# Patient Record
Sex: Female | Born: 1935 | Race: White | Hispanic: No | Smoking: Former smoker
Health system: Southern US, Community
[De-identification: ages and names within clinical notes are randomized; demographics above are authoritative.]

## PROBLEM LIST (undated history)

## (undated) DIAGNOSIS — Z8711 Personal history of peptic ulcer disease: Secondary | ICD-10-CM

## (undated) DIAGNOSIS — F329 Major depressive disorder, single episode, unspecified: Secondary | ICD-10-CM

## (undated) DIAGNOSIS — J449 Chronic obstructive pulmonary disease, unspecified: Secondary | ICD-10-CM

## (undated) DIAGNOSIS — J45909 Unspecified asthma, uncomplicated: Secondary | ICD-10-CM

## (undated) DIAGNOSIS — M810 Age-related osteoporosis without current pathological fracture: Secondary | ICD-10-CM

## (undated) DIAGNOSIS — M1990 Unspecified osteoarthritis, unspecified site: Secondary | ICD-10-CM

## (undated) DIAGNOSIS — F32A Depression, unspecified: Secondary | ICD-10-CM

## (undated) DIAGNOSIS — IMO0001 Reserved for inherently not codable concepts without codable children: Secondary | ICD-10-CM

## (undated) DIAGNOSIS — I10 Essential (primary) hypertension: Secondary | ICD-10-CM

## (undated) DIAGNOSIS — F0390 Unspecified dementia without behavioral disturbance: Secondary | ICD-10-CM

## (undated) DIAGNOSIS — E041 Nontoxic single thyroid nodule: Secondary | ICD-10-CM

## (undated) DIAGNOSIS — Z8489 Family history of other specified conditions: Secondary | ICD-10-CM

## (undated) DIAGNOSIS — Z972 Presence of dental prosthetic device (complete) (partial): Secondary | ICD-10-CM

## (undated) DIAGNOSIS — E7800 Pure hypercholesterolemia, unspecified: Secondary | ICD-10-CM

## (undated) HISTORY — PX: ORIF WRIST FRACTURE: SHX2133

## (undated) HISTORY — PX: CHOLECYSTECTOMY: SHX55

---

## 2016-01-12 NOTE — Discharge Instructions (Signed)

## 2016-01-13 ENCOUNTER — Encounter: Payer: Self-pay | Admitting: *Deleted

## 2016-01-14 ENCOUNTER — Ambulatory Visit: Payer: Medicare Other | Admitting: Anesthesiology

## 2016-01-14 ENCOUNTER — Ambulatory Visit
Admission: RE | Admit: 2016-01-14 | Discharge: 2016-01-14 | Disposition: A | Discharge status: Home / Self Care | Payer: Medicare Other | Source: Ambulatory Visit | Attending: Ophthalmology | Admitting: Ophthalmology

## 2016-01-14 ENCOUNTER — Encounter
Admission: RE | Disposition: A | Discharge status: Home / Self Care | Payer: Self-pay | Source: Ambulatory Visit | Attending: Ophthalmology

## 2016-01-14 DIAGNOSIS — Z9049 Acquired absence of other specified parts of digestive tract: Secondary | ICD-10-CM | POA: Diagnosis not present

## 2016-01-14 DIAGNOSIS — F329 Major depressive disorder, single episode, unspecified: Secondary | ICD-10-CM | POA: Diagnosis not present

## 2016-01-14 DIAGNOSIS — J449 Chronic obstructive pulmonary disease, unspecified: Secondary | ICD-10-CM | POA: Insufficient documentation

## 2016-01-14 DIAGNOSIS — H2512 Age-related nuclear cataract, left eye: Secondary | ICD-10-CM | POA: Insufficient documentation

## 2016-01-14 DIAGNOSIS — M810 Age-related osteoporosis without current pathological fracture: Secondary | ICD-10-CM | POA: Insufficient documentation

## 2016-01-14 DIAGNOSIS — E7800 Pure hypercholesterolemia, unspecified: Secondary | ICD-10-CM | POA: Insufficient documentation

## 2016-01-14 DIAGNOSIS — Z8711 Personal history of peptic ulcer disease: Secondary | ICD-10-CM | POA: Insufficient documentation

## 2016-01-14 DIAGNOSIS — Z87891 Personal history of nicotine dependence: Secondary | ICD-10-CM | POA: Diagnosis not present

## 2016-01-14 DIAGNOSIS — M1990 Unspecified osteoarthritis, unspecified site: Secondary | ICD-10-CM | POA: Insufficient documentation

## 2016-01-14 DIAGNOSIS — F0390 Unspecified dementia without behavioral disturbance: Secondary | ICD-10-CM | POA: Diagnosis not present

## 2016-01-14 HISTORY — DX: Unspecified osteoarthritis, unspecified site: M19.90

## 2016-01-14 HISTORY — DX: Pure hypercholesterolemia, unspecified: E78.00

## 2016-01-14 HISTORY — DX: Chronic obstructive pulmonary disease, unspecified: J44.9

## 2016-01-14 HISTORY — DX: Unspecified dementia, unspecified severity, without behavioral disturbance, psychotic disturbance, mood disturbance, and anxiety: F03.90

## 2016-01-14 HISTORY — DX: Presence of dental prosthetic device (complete) (partial): Z97.2

## 2016-01-14 HISTORY — DX: Reserved for inherently not codable concepts without codable children: IMO0001

## 2016-01-14 HISTORY — PX: CATARACT EXTRACTION W/PHACO: SHX586

## 2016-01-14 HISTORY — DX: Family history of other specified conditions: Z84.89

## 2016-01-14 HISTORY — DX: Unspecified asthma, uncomplicated: J45.909

## 2016-01-14 HISTORY — DX: Personal history of peptic ulcer disease: Z87.11

## 2016-01-14 HISTORY — DX: Essential (primary) hypertension: I10

## 2016-01-14 HISTORY — DX: Major depressive disorder, single episode, unspecified: F32.9

## 2016-01-14 HISTORY — DX: Depression, unspecified: F32.A

## 2016-01-14 HISTORY — DX: Nontoxic single thyroid nodule: E04.1

## 2016-01-14 HISTORY — DX: Age-related osteoporosis without current pathological fracture: M81.0

## 2016-01-14 SURGERY — PHACOEMULSIFICATION, CATARACT, WITH IOL INSERTION
Anesthesia: Monitor Anesthesia Care | Site: Eye | Laterality: Right | Wound class: Clean

## 2016-01-14 MED ORDER — POVIDONE-IODINE 5 % OP SOLN
1.0000 "application " | OPHTHALMIC | Status: DC | PRN
  Administered 2016-01-14: 1 via OPHTHALMIC

## 2016-01-14 MED ORDER — ARMC OPHTHALMIC DILATING GEL
1.0000 "application " | OPHTHALMIC | Status: DC | PRN
  Administered 2016-01-14 (×2): 1 via OPHTHALMIC

## 2016-01-14 MED ORDER — TETRACAINE HCL 0.5 % OP SOLN
1.0000 [drp] | OPHTHALMIC | Status: DC | PRN
  Administered 2016-01-14: 1 [drp] via OPHTHALMIC

## 2016-01-14 MED ORDER — TIMOLOL MALEATE 0.5 % OP SOLN
OPHTHALMIC | Status: DC | PRN
  Administered 2016-01-14: 1 [drp] via OPHTHALMIC

## 2016-01-14 MED ORDER — NA HYALUR & NA CHOND-NA HYALUR 0.4-0.35 ML IO KIT
PACK | INTRAOCULAR | Status: DC | PRN
  Administered 2016-01-14: 1 mL via INTRAOCULAR

## 2016-01-14 MED ORDER — EPINEPHRINE HCL 1 MG/ML IJ SOLN
INTRAOCULAR | Status: DC | PRN
  Administered 2016-01-14: 68 mL via OPHTHALMIC

## 2016-01-14 MED ORDER — LIDOCAINE HCL (PF) 4 % IJ SOLN
INTRAMUSCULAR | Status: DC | PRN
  Administered 2016-01-14: 1 mL via OPHTHALMIC

## 2016-01-14 MED ORDER — BRIMONIDINE TARTRATE 0.2 % OP SOLN
OPHTHALMIC | Status: DC | PRN
  Administered 2016-01-14: 1 [drp] via OPHTHALMIC

## 2016-01-14 MED ORDER — FENTANYL CITRATE (PF) 100 MCG/2ML IJ SOLN
INTRAMUSCULAR | Status: DC | PRN
  Administered 2016-01-14: 50 ug via INTRAVENOUS

## 2016-01-14 MED ORDER — CEFUROXIME OPHTHALMIC INJECTION 1 MG/0.1 ML
INJECTION | OPHTHALMIC | Status: DC | PRN
  Administered 2016-01-14: 0.1 mL via OPHTHALMIC

## 2016-01-14 MED ORDER — MIDAZOLAM HCL 2 MG/2ML IJ SOLN
INTRAMUSCULAR | Status: DC | PRN
  Administered 2016-01-14: 1 mg via INTRAVENOUS

## 2016-01-14 SURGICAL SUPPLY — 25 items
CANNULA ANT/CHMB 27GA (MISCELLANEOUS) ×3 IMPLANT
CARTRIDGE ABBOTT (MISCELLANEOUS) IMPLANT
GLOVE SURG LX 7.5 STRW (GLOVE) ×4
GLOVE SURG LX STRL 7.5 STRW (GLOVE) ×2 IMPLANT
GLOVE SURG TRIUMPH 8.0 PF LTX (GLOVE) ×3 IMPLANT
GOWN STRL REUS W/ TWL LRG LVL3 (GOWN DISPOSABLE) ×2 IMPLANT
GOWN STRL REUS W/TWL LRG LVL3 (GOWN DISPOSABLE) ×4
LENS IOL TECNIS ITEC 24.5 (Intraocular Lens) ×3 IMPLANT
MARKER SKIN DUAL TIP RULER LAB (MISCELLANEOUS) ×3 IMPLANT
NDL RETROBULBAR .5 NSTRL (NEEDLE) IMPLANT
NEEDLE FILTER BLUNT 18X 1/2SAF (NEEDLE) ×2
NEEDLE FILTER BLUNT 18X1 1/2 (NEEDLE) ×1 IMPLANT
PACK CATARACT BRASINGTON (MISCELLANEOUS) ×3 IMPLANT
PACK EYE AFTER SURG (MISCELLANEOUS) ×3 IMPLANT
PACK OPTHALMIC (MISCELLANEOUS) ×3 IMPLANT
RING MALYGIN 7.0 (MISCELLANEOUS) IMPLANT
SUT ETHILON 10-0 CS-B-6CS-B-6 (SUTURE)
SUT VICRYL  9 0 (SUTURE)
SUT VICRYL 9 0 (SUTURE) IMPLANT
SUTURE EHLN 10-0 CS-B-6CS-B-6 (SUTURE) IMPLANT
SYR 3ML LL SCALE MARK (SYRINGE) ×3 IMPLANT
SYR 5ML LL (SYRINGE) ×3 IMPLANT
SYR TB 1ML LUER SLIP (SYRINGE) ×3 IMPLANT
WATER STERILE IRR 250ML POUR (IV SOLUTION) ×3 IMPLANT
WIPE NON LINTING 3.25X3.25 (MISCELLANEOUS) ×3 IMPLANT

## 2016-01-14 NOTE — Anesthesia Procedure Notes (Signed)
Procedure Name: MAC Date/Time: 01/14/2016 10:18 AM Performed by: Maryan RuedWILSON, Harsimran Westman M Pre-anesthesia Checklist: Emergency Drugs available, Patient identified, Suction available, Patient being monitored and Timeout performed Patient Re-evaluated:Patient Re-evaluated prior to inductionOxygen Delivery Method: Nasal cannula

## 2016-01-14 NOTE — Op Note (Signed)
OPERATIVE NOTE  Allison MelnickBlanche Holland [id number] 01/14/2016   PREOPERATIVE DIAGNOSIS:  Nuclear sclerotic cataract left eye. H25.12   POSTOPERATIVE DIAGNOSIS:    Nuclear sclerotic cataract left eye.     PROCEDURE:  Phacoemusification with posterior chamber intraocular lens placement of the left eye   LENS:   Implant Name Type Inv. Item Serial No. Manufacturer Lot No. LRB No. Used  LENS IOL DIOP 24.5 - [id number] Intraocular Lens LENS IOL DIOP 24.5 [id number]   Right 1        ULTRASOUND TIME: 15  % of 1 minutes 41 seconds, CDE 15.5  SURGEON:  Deirdre Evenerhadwick R. Danaiya Steadman, MD   ANESTHESIA:  Topical with tetracaine drops and 2% Xylocaine jelly, augmented with 1% preservative-free intracameral lidocaine.    COMPLICATIONS:  None.   DESCRIPTION OF PROCEDURE:  The patient was identified in the holding room and transported to the operating room and placed in the supine position under the operating microscope.  The left eye was identified as the operative eye and it was prepped and draped in the usual sterile ophthalmic fashion.   A 1 millimeter clear-corneal paracentesis was made at the 1:30 position.  0.5 ml of preservative-free 1% lidocaine was injected into the anterior chamber.  The anterior chamber was filled with Viscoat viscoelastic.  A 2.4 millimeter keratome was used to make a near-clear corneal incision at the 10:30 position.  .  A curvilinear capsulorrhexis was made with a cystotome and capsulorrhexis forceps.  Balanced salt solution was used to hydrodissect and hydrodelineate the nucleus.   Phacoemulsification was then used in stop and chop fashion to remove the lens nucleus and epinucleus.  The remaining cortex was then removed using the irrigation and aspiration handpiece. Provisc was then placed into the capsular bag to distend it for lens placement.  A lens was then injected into the capsular bag.  The remaining viscoelastic was aspirated.   Wounds were hydrated with balanced salt  solution.  The anterior chamber was inflated to a physiologic pressure with balanced salt solution.  No wound leaks were noted. Cefuroxime 0.1 ml of a 10mg /ml solution was injected into the anterior chamber for a dose of 1 mg of intracameral antibiotic at the completion of the case.   Timolol and Brimonidine drops were applied to the eye.  The patient was taken to the recovery room in stable condition without complications of anesthesia or surgery.  [name] 01/14/2016, 10:40 AM

## 2016-01-14 NOTE — Anesthesia Postprocedure Evaluation (Signed)
Anesthesia Post Note  Patient: Allison Holland  Procedure(s) Performed: Procedure(s) (LRB): CATARACT EXTRACTION PHACO AND INTRAOCULAR LENS PLACEMENT (IOC) (Right)  Patient location during evaluation: PACU Anesthesia Type: MAC Level of consciousness: awake and alert and oriented Pain management: satisfactory to patient Vital Signs Assessment: post-procedure vital signs reviewed and stable Respiratory status: spontaneous breathing, nonlabored ventilation and respiratory function stable Cardiovascular status: blood pressure returned to baseline and stable Postop Assessment: Adequate PO intake and No signs of nausea or vomiting Anesthetic complications: no    Cherly BeachStella, Sugey Trevathan J

## 2016-01-14 NOTE — H&P (Signed)
  The History and Physical notes are on paper, have been signed, and are to be scanned. The patient remains stable and unchanged from the H&P.   Previous H&P reviewed, patient examined, and there are no changes.  Allison Holland 01/14/2016 9:47 AM

## 2016-01-14 NOTE — Transfer of Care (Signed)
Immediate Anesthesia Transfer of Care Note  Patient: Allison MelnickBlanche Holland  Procedure(s) Performed: Procedure(s): CATARACT EXTRACTION PHACO AND INTRAOCULAR LENS PLACEMENT (IOC) (Right)  Patient Location: PACU  Anesthesia Type: MAC  Level of Consciousness: awake, alert  and patient cooperative  Airway and Oxygen Therapy: Patient Spontanous Breathing and Patient connected to supplemental oxygen  Post-op Assessment: Post-op Vital signs reviewed, Patient's Cardiovascular Status Stable, Respiratory Function Stable, Patent Airway and No signs of Nausea or vomiting  Post-op Vital Signs: Reviewed and stable  Complications: No apparent anesthesia complications

## 2016-01-14 NOTE — Anesthesia Preprocedure Evaluation (Signed)
Anesthesia Evaluation  Patient identified by MRN, date of birth, ID band Patient awake    Reviewed: Allergy & Precautions, H&P , NPO status , Patient's Chart, lab work & pertinent test results  Airway Mallampati: II  TM Distance: >3 FB Neck ROM: full    Dental no notable dental hx. (+) Upper Dentures, Lower Dentures   Pulmonary COPD, former smoker,    Pulmonary exam normal        Cardiovascular hypertension, Normal cardiovascular exam     Neuro/Psych PSYCHIATRIC DISORDERS    GI/Hepatic   Endo/Other    Renal/GU      Musculoskeletal   Abdominal   Peds  Hematology   Anesthesia Other Findings   Reproductive/Obstetrics                             Anesthesia Physical Anesthesia Plan  ASA: III  Anesthesia Plan: MAC   Post-op Pain Management:    Induction:   Airway Management Planned:   Additional Equipment:   Intra-op Plan:   Post-operative Plan:   Informed Consent: I have reviewed the patients History and Physical, chart, labs and discussed the procedure including the risks, benefits and alternatives for the proposed anesthesia with the patient or authorized representative who has indicated his/her understanding and acceptance.     Plan Discussed with:   Anesthesia Plan Comments:         Anesthesia Quick Evaluation

## 2016-01-15 ENCOUNTER — Encounter: Payer: Self-pay | Admitting: Ophthalmology

## 2016-02-13 ENCOUNTER — Encounter: Payer: Self-pay | Admitting: *Deleted

## 2016-02-16 NOTE — Discharge Instructions (Signed)

## 2016-02-18 ENCOUNTER — Ambulatory Visit
Admission: RE | Admit: 2016-02-18 | Discharge: 2016-02-18 | Disposition: A | Discharge status: Home / Self Care | Payer: Medicare Other | Source: Ambulatory Visit | Attending: Ophthalmology | Admitting: Ophthalmology

## 2016-02-18 ENCOUNTER — Encounter: Payer: Self-pay | Admitting: *Deleted

## 2016-02-18 ENCOUNTER — Encounter
Admission: RE | Disposition: A | Discharge status: Home / Self Care | Payer: Self-pay | Source: Ambulatory Visit | Attending: Ophthalmology

## 2016-02-18 ENCOUNTER — Ambulatory Visit: Payer: Medicare Other | Admitting: Anesthesiology

## 2016-02-18 DIAGNOSIS — J449 Chronic obstructive pulmonary disease, unspecified: Secondary | ICD-10-CM | POA: Insufficient documentation

## 2016-02-18 DIAGNOSIS — F329 Major depressive disorder, single episode, unspecified: Secondary | ICD-10-CM | POA: Insufficient documentation

## 2016-02-18 DIAGNOSIS — M810 Age-related osteoporosis without current pathological fracture: Secondary | ICD-10-CM | POA: Insufficient documentation

## 2016-02-18 DIAGNOSIS — Z87891 Personal history of nicotine dependence: Secondary | ICD-10-CM | POA: Diagnosis not present

## 2016-02-18 DIAGNOSIS — M1990 Unspecified osteoarthritis, unspecified site: Secondary | ICD-10-CM | POA: Insufficient documentation

## 2016-02-18 DIAGNOSIS — F0390 Unspecified dementia without behavioral disturbance: Secondary | ICD-10-CM | POA: Diagnosis not present

## 2016-02-18 DIAGNOSIS — E7800 Pure hypercholesterolemia, unspecified: Secondary | ICD-10-CM | POA: Diagnosis not present

## 2016-02-18 DIAGNOSIS — I10 Essential (primary) hypertension: Secondary | ICD-10-CM | POA: Diagnosis not present

## 2016-02-18 DIAGNOSIS — H2512 Age-related nuclear cataract, left eye: Secondary | ICD-10-CM | POA: Diagnosis present

## 2016-02-18 HISTORY — PX: CATARACT EXTRACTION W/PHACO: SHX586

## 2016-02-18 SURGERY — PHACOEMULSIFICATION, CATARACT, WITH IOL INSERTION
Anesthesia: Monitor Anesthesia Care | Laterality: Left | Wound class: Clean

## 2016-02-18 MED ORDER — NA HYALUR & NA CHOND-NA HYALUR 0.4-0.35 ML IO KIT
PACK | INTRAOCULAR | Status: DC | PRN
  Administered 2016-02-18: 1 mL via INTRAOCULAR

## 2016-02-18 MED ORDER — EPINEPHRINE HCL 1 MG/ML IJ SOLN
INTRAMUSCULAR | Status: DC | PRN
  Administered 2016-02-18: 56 mL via OPHTHALMIC

## 2016-02-18 MED ORDER — LIDOCAINE HCL (PF) 4 % IJ SOLN
INTRAOCULAR | Status: DC | PRN
  Administered 2016-02-18: 1 mL via OPHTHALMIC

## 2016-02-18 MED ORDER — ARMC OPHTHALMIC DILATING DROPS
1.0000 "application " | OPHTHALMIC | Status: DC | PRN
  Administered 2016-02-18 (×3): 1 via OPHTHALMIC

## 2016-02-18 MED ORDER — BRIMONIDINE TARTRATE 0.2 % OP SOLN
OPHTHALMIC | Status: DC | PRN
  Administered 2016-02-18: 1 [drp] via OPHTHALMIC

## 2016-02-18 MED ORDER — FENTANYL CITRATE (PF) 100 MCG/2ML IJ SOLN
INTRAMUSCULAR | Status: DC | PRN
  Administered 2016-02-18: 50 ug via INTRAVENOUS

## 2016-02-18 MED ORDER — MOXIFLOXACIN HCL 0.5 % OP SOLN
1.0000 [drp] | OPHTHALMIC | Status: DC | PRN
  Administered 2016-02-18 (×3): 1 [drp] via OPHTHALMIC

## 2016-02-18 MED ORDER — LACTATED RINGERS IV SOLN: INTRAVENOUS | Status: DC

## 2016-02-18 MED ORDER — MIDAZOLAM HCL 2 MG/2ML IJ SOLN
INTRAMUSCULAR | Status: DC | PRN
  Administered 2016-02-18: 1 mg via INTRAVENOUS

## 2016-02-18 MED ORDER — TIMOLOL MALEATE 0.5 % OP SOLN
OPHTHALMIC | Status: DC | PRN
  Administered 2016-02-18: 1 [drp] via OPHTHALMIC

## 2016-02-18 MED ORDER — CEFUROXIME OPHTHALMIC INJECTION 1 MG/0.1 ML
INJECTION | OPHTHALMIC | Status: DC | PRN
  Administered 2016-02-18: 0.1 mL via OPHTHALMIC

## 2016-02-18 SURGICAL SUPPLY — 25 items
CANNULA ANT/CHMB 27GA (MISCELLANEOUS) ×3 IMPLANT
CARTRIDGE ABBOTT (MISCELLANEOUS) IMPLANT
GLOVE SURG LX 7.5 STRW (GLOVE) ×2
GLOVE SURG LX STRL 7.5 STRW (GLOVE) ×1 IMPLANT
GLOVE SURG TRIUMPH 8.0 PF LTX (GLOVE) ×3 IMPLANT
GOWN STRL REUS W/ TWL LRG LVL3 (GOWN DISPOSABLE) ×2 IMPLANT
GOWN STRL REUS W/TWL LRG LVL3 (GOWN DISPOSABLE) ×4
LENS IOL TECNIS ITEC 24.5 (Intraocular Lens) ×3 IMPLANT
MARKER SKIN DUAL TIP RULER LAB (MISCELLANEOUS) ×3 IMPLANT
NDL RETROBULBAR .5 NSTRL (NEEDLE) IMPLANT
NEEDLE FILTER BLUNT 18X 1/2SAF (NEEDLE) ×2
NEEDLE FILTER BLUNT 18X1 1/2 (NEEDLE) ×1 IMPLANT
PACK CATARACT BRASINGTON (MISCELLANEOUS) ×3 IMPLANT
PACK EYE AFTER SURG (MISCELLANEOUS) ×3 IMPLANT
PACK OPTHALMIC (MISCELLANEOUS) ×3 IMPLANT
RING MALYGIN 7.0 (MISCELLANEOUS) IMPLANT
SUT ETHILON 10-0 CS-B-6CS-B-6 (SUTURE)
SUT VICRYL  9 0 (SUTURE)
SUT VICRYL 9 0 (SUTURE) IMPLANT
SUTURE EHLN 10-0 CS-B-6CS-B-6 (SUTURE) IMPLANT
SYR 3ML LL SCALE MARK (SYRINGE) ×3 IMPLANT
SYR 5ML LL (SYRINGE) ×3 IMPLANT
SYR TB 1ML LUER SLIP (SYRINGE) ×3 IMPLANT
WATER STERILE IRR 250ML POUR (IV SOLUTION) ×3 IMPLANT
WIPE NON LINTING 3.25X3.25 (MISCELLANEOUS) ×3 IMPLANT

## 2016-02-18 NOTE — Anesthesia Preprocedure Evaluation (Signed)
Anesthesia Evaluation  Patient identified by MRN, date of birth, ID band Patient awake    Reviewed: Allergy & Precautions, H&P , NPO status , Patient's Chart, lab work & pertinent test results  History of Anesthesia Complications Negative for: history of anesthetic complications  Airway Mallampati: II  TM Distance: >3 FB Neck ROM: full    Dental  (+) Edentulous Upper   Pulmonary shortness of breath, COPD, former smoker,    Pulmonary exam normal        Cardiovascular hypertension, On Medications Normal cardiovascular exam     Neuro/Psych    GI/Hepatic negative GI ROS, Neg liver ROS,   Endo/Other  negative endocrine ROS  Renal/GU negative Renal ROS     Musculoskeletal   Abdominal   Peds  Hematology negative hematology ROS (+)   Anesthesia Other Findings   Reproductive/Obstetrics                             Anesthesia Physical Anesthesia Plan  ASA: II  Anesthesia Plan: MAC   Post-op Pain Management:    Induction:   Airway Management Planned:   Additional Equipment:   Intra-op Plan:   Post-operative Plan:   Informed Consent:   Plan Discussed with:   Anesthesia Plan Comments:         Anesthesia Quick Evaluation

## 2016-02-18 NOTE — Transfer of Care (Signed)
Immediate Anesthesia Transfer of Care Note  Patient: Allison Holland  Procedure(s) Performed: Procedure(s): CATARACT EXTRACTION PHACO AND INTRAOCULAR LENS PLACEMENT (IOC) (Left)  Patient Location: PACU  Anesthesia Type: MAC  Level of Consciousness: awake, alert  and patient cooperative  Airway and Oxygen Therapy: Patient Spontanous Breathing and Patient connected to supplemental oxygen  Post-op Assessment: Post-op Vital signs reviewed, Patient's Cardiovascular Status Stable, Respiratory Function Stable, Patent Airway and No signs of Nausea or vomiting  Post-op Vital Signs: Reviewed and stable  Complications: No apparent anesthesia complications

## 2016-02-18 NOTE — H&P (Signed)
The History and Physical notes are on paper, have been signed, and are to be scanned. The patient remains stable and unchanged from the H&P.   Previous H&P reviewed, patient examined, and there are no changes.  Allison Holland 02/18/2016 10:28 AM   

## 2016-02-18 NOTE — Anesthesia Postprocedure Evaluation (Signed)
Anesthesia Post Note  Patient: Allison MelnickBlanche Holland  Procedure(s) Performed: Procedure(s) (LRB): CATARACT EXTRACTION PHACO AND INTRAOCULAR LENS PLACEMENT (IOC) (Left)  Patient location during evaluation: PACU Anesthesia Type: MAC Level of consciousness: awake and alert Pain management: pain level controlled Vital Signs Assessment: post-procedure vital signs reviewed and stable Respiratory status: spontaneous breathing Cardiovascular status: blood pressure returned to baseline Postop Assessment: no headache Anesthetic complications: no    Verner Cholunkle, III,  Laithan Conchas D

## 2016-02-18 NOTE — Op Note (Signed)
OPERATIVE NOTE  Allison MelnickBlanche Holland [id number] 02/18/2016   PREOPERATIVE DIAGNOSIS:  Nuclear sclerotic cataract left eye. H25.12   POSTOPERATIVE DIAGNOSIS:    Nuclear sclerotic cataract left eye.     PROCEDURE:  Phacoemusification with posterior chamber intraocular lens placement of the left eye   LENS:   Implant Name Type Inv. Item Serial No. Manufacturer Lot No. LRB No. Used  LENS IOL DIOP 24.5 - [id number] Intraocular Lens LENS IOL DIOP 24.5 [id number]   Left 1        ULTRASOUND TIME: 16  % of 0 minutes 58 seconds, CDE 9.5  SURGEON:  Deirdre Evenerhadwick R. Chanique Duca, MD   ANESTHESIA:  Topical with tetracaine drops and 2% Xylocaine jelly, augmented with 1% preservative-free intracameral lidocaine.    COMPLICATIONS:  None.   DESCRIPTION OF PROCEDURE:  The patient was identified in the holding room and transported to the operating room and placed in the supine position under the operating microscope.  The left eye was identified as the operative eye and it was prepped and draped in the usual sterile ophthalmic fashion.   A 1 millimeter clear-corneal paracentesis was made at the 1:30 position.  0.5 ml of preservative-free 1% lidocaine was injected into the anterior chamber.  The anterior chamber was filled with Viscoat viscoelastic.  A 2.4 millimeter keratome was used to make a near-clear corneal incision at the 10:30 position.  .  A curvilinear capsulorrhexis was made with a cystotome and capsulorrhexis forceps.  Balanced salt solution was used to hydrodissect and hydrodelineate the nucleus.   Phacoemulsification was then used in stop and chop fashion to remove the lens nucleus and epinucleus.  The remaining cortex was then removed using the irrigation and aspiration handpiece. Provisc was then placed into the capsular bag to distend it for lens placement.  A lens was then injected into the capsular bag.  The remaining viscoelastic was aspirated.   Wounds were hydrated with balanced salt  solution.  The anterior chamber was inflated to a physiologic pressure with balanced salt solution.  No wound leaks were noted. Cefuroxime 0.1 ml of a 10mg /ml solution was injected into the anterior chamber for a dose of 1 mg of intracameral antibiotic at the completion of the case.   Timolol and Brimonidine drops were applied to the eye.  The patient was taken to the recovery room in stable condition without complications of anesthesia or surgery.  [name] 02/18/2016, 11:00 AM

## 2016-02-18 NOTE — Anesthesia Procedure Notes (Signed)
Procedure Name: MAC Performed by: Infantof Villagomez Pre-anesthesia Checklist: Patient identified, Emergency Drugs available, Suction available, Timeout performed and Patient being monitored Patient Re-evaluated:Patient Re-evaluated prior to inductionOxygen Delivery Method: Nasal cannula Placement Confirmation: positive ETCO2     

## 2016-02-19 ENCOUNTER — Encounter: Payer: Self-pay | Admitting: Ophthalmology

## 2016-11-13 ENCOUNTER — Observation Stay
Admission: EM | Admit: 2016-11-13 | Discharge: 2016-11-15 | Disposition: A | Discharge status: Home / Self Care | Payer: Medicare Other | Attending: Internal Medicine | Admitting: Internal Medicine

## 2016-11-13 ENCOUNTER — Emergency Department: Payer: Medicare Other

## 2016-11-13 ENCOUNTER — Encounter: Payer: Self-pay | Admitting: *Deleted

## 2016-11-13 DIAGNOSIS — E785 Hyperlipidemia, unspecified: Secondary | ICD-10-CM | POA: Diagnosis not present

## 2016-11-13 DIAGNOSIS — R197 Diarrhea, unspecified: Secondary | ICD-10-CM | POA: Diagnosis present

## 2016-11-13 DIAGNOSIS — I959 Hypotension, unspecified: Secondary | ICD-10-CM | POA: Diagnosis not present

## 2016-11-13 DIAGNOSIS — K219 Gastro-esophageal reflux disease without esophagitis: Secondary | ICD-10-CM | POA: Diagnosis not present

## 2016-11-13 DIAGNOSIS — N179 Acute kidney failure, unspecified: Secondary | ICD-10-CM | POA: Diagnosis not present

## 2016-11-13 DIAGNOSIS — F0390 Unspecified dementia without behavioral disturbance: Secondary | ICD-10-CM | POA: Diagnosis not present

## 2016-11-13 DIAGNOSIS — E860 Dehydration: Secondary | ICD-10-CM | POA: Diagnosis present

## 2016-11-13 DIAGNOSIS — E7800 Pure hypercholesterolemia, unspecified: Secondary | ICD-10-CM | POA: Diagnosis not present

## 2016-11-13 DIAGNOSIS — Z87891 Personal history of nicotine dependence: Secondary | ICD-10-CM | POA: Insufficient documentation

## 2016-11-13 DIAGNOSIS — K529 Noninfective gastroenteritis and colitis, unspecified: Secondary | ICD-10-CM | POA: Diagnosis present

## 2016-11-13 DIAGNOSIS — A0811 Acute gastroenteropathy due to Norwalk agent: Secondary | ICD-10-CM

## 2016-11-13 DIAGNOSIS — Z8711 Personal history of peptic ulcer disease: Secondary | ICD-10-CM | POA: Diagnosis not present

## 2016-11-13 DIAGNOSIS — R531 Weakness: Secondary | ICD-10-CM

## 2016-11-13 DIAGNOSIS — E876 Hypokalemia: Secondary | ICD-10-CM

## 2016-11-13 DIAGNOSIS — I10 Essential (primary) hypertension: Secondary | ICD-10-CM | POA: Diagnosis not present

## 2016-11-13 DIAGNOSIS — R400 Somnolence: Secondary | ICD-10-CM

## 2016-11-13 DIAGNOSIS — D696 Thrombocytopenia, unspecified: Secondary | ICD-10-CM

## 2016-11-13 DIAGNOSIS — F32A Depression, unspecified: Secondary | ICD-10-CM | POA: Diagnosis present

## 2016-11-13 DIAGNOSIS — J449 Chronic obstructive pulmonary disease, unspecified: Secondary | ICD-10-CM | POA: Diagnosis present

## 2016-11-13 DIAGNOSIS — R4182 Altered mental status, unspecified: Secondary | ICD-10-CM

## 2016-11-13 DIAGNOSIS — R571 Hypovolemic shock: Secondary | ICD-10-CM

## 2016-11-13 DIAGNOSIS — M810 Age-related osteoporosis without current pathological fracture: Secondary | ICD-10-CM | POA: Insufficient documentation

## 2016-11-13 DIAGNOSIS — F329 Major depressive disorder, single episode, unspecified: Secondary | ICD-10-CM | POA: Insufficient documentation

## 2016-11-13 DIAGNOSIS — M1990 Unspecified osteoarthritis, unspecified site: Secondary | ICD-10-CM | POA: Diagnosis not present

## 2016-11-13 LAB — CBC WITH DIFFERENTIAL/PLATELET
BASOS ABS: 0 10*3/uL (ref 0–0.1)
BASOS PCT: 0 %
EOS ABS: 0 10*3/uL (ref 0–0.7)
Eosinophils Relative: 0 %
HEMATOCRIT: 39.2 % (ref 35.0–47.0)
HEMOGLOBIN: 13.6 g/dL (ref 12.0–16.0)
Lymphocytes Relative: 2 %
Lymphs Abs: 0.2 10*3/uL — ABNORMAL LOW (ref 1.0–3.6)
MCH: 33.1 pg (ref 26.0–34.0)
MCHC: 34.7 g/dL (ref 32.0–36.0)
MCV: 95.4 fL (ref 80.0–100.0)
Monocytes Absolute: 0.3 10*3/uL (ref 0.2–0.9)
Monocytes Relative: 3 %
NEUTROS ABS: 9.2 10*3/uL — AB (ref 1.4–6.5)
NEUTROS PCT: 95 %
Platelets: 168 10*3/uL (ref 150–440)
RBC: 4.1 MIL/uL (ref 3.80–5.20)
RDW: 13.3 % (ref 11.5–14.5)
WBC: 9.7 10*3/uL (ref 3.6–11.0)

## 2016-11-13 LAB — BASIC METABOLIC PANEL
ANION GAP: 8 (ref 5–15)
Anion gap: 7 (ref 5–15)
BUN: 31 mg/dL — ABNORMAL HIGH (ref 6–20)
BUN: 28 mg/dL — AB (ref 6–20)
CHLORIDE: 102 mmol/L (ref 101–111)
CO2: 27 mmol/L (ref 22–32)
CO2: 24 mmol/L (ref 22–32)
CREATININE: 1.21 mg/dL — AB (ref 0.44–1.00)
CREATININE: 0.92 mg/dL (ref 0.44–1.00)
Calcium: 8.5 mg/dL — ABNORMAL LOW (ref 8.9–10.3)
Calcium: 7.7 mg/dL — ABNORMAL LOW (ref 8.9–10.3)
Chloride: 108 mmol/L (ref 101–111)
GFR calc Af Amer: >60 mL/min (ref >60)
GFR calc non Af Amer: 41 mL/min — ABNORMAL LOW (ref >60)
GFR calc non Af Amer: 58 mL/min — ABNORMAL LOW (ref >60)
GFR, EST AFRICAN AMERICAN: 48 mL/min — AB (ref >60)
Glucose, Bld: 160 mg/dL — ABNORMAL HIGH (ref 65–99)
Glucose, Bld: 121 mg/dL — ABNORMAL HIGH (ref 65–99)
POTASSIUM: 3.4 mmol/L — AB (ref 3.5–5.1)
Potassium: 3.3 mmol/L — ABNORMAL LOW (ref 3.5–5.1)
SODIUM: 137 mmol/L (ref 135–145)
Sodium: 139 mmol/L (ref 135–145)

## 2016-11-13 LAB — GASTROINTESTINAL PANEL BY PCR, STOOL (REPLACES STOOL CULTURE)
ADENOVIRUS F40/41: NOT DETECTED
ASTROVIRUS: NOT DETECTED
CAMPYLOBACTER SPECIES: NOT DETECTED
CYCLOSPORA CAYETANENSIS: NOT DETECTED
Cryptosporidium: NOT DETECTED
ENTAMOEBA HISTOLYTICA: NOT DETECTED
ENTEROPATHOGENIC E COLI (EPEC): NOT DETECTED
ENTEROTOXIGENIC E COLI (ETEC): NOT DETECTED
Enteroaggregative E coli (EAEC): NOT DETECTED
Giardia lamblia: NOT DETECTED
Norovirus GI/GII: DETECTED — AB
Plesimonas shigelloides: NOT DETECTED
Rotavirus A: NOT DETECTED
Salmonella species: NOT DETECTED
Sapovirus (I, II, IV, and V): NOT DETECTED
Shiga like toxin producing E coli (STEC): NOT DETECTED
Shigella/Enteroinvasive E coli (EIEC): NOT DETECTED
VIBRIO CHOLERAE: NOT DETECTED
VIBRIO SPECIES: NOT DETECTED
Yersinia enterocolitica: NOT DETECTED

## 2016-11-13 LAB — C DIFFICILE QUICK SCREEN W PCR REFLEX
C DIFFICILE (CDIFF) INTERP: NOT DETECTED
C DIFFICILE (CDIFF) TOXIN: NEGATIVE
C DIFFICLE (CDIFF) ANTIGEN: NEGATIVE

## 2016-11-13 LAB — MAGNESIUM: MAGNESIUM: 1.7 mg/dL (ref 1.7–2.4)

## 2016-11-13 MED ORDER — ENOXAPARIN SODIUM 40 MG/0.4ML ~~LOC~~ SOLN
40.0000 mg | SUBCUTANEOUS | Status: DC
  Administered 2016-11-13 – 2016-11-14 (×2): 40 mg via SUBCUTANEOUS
  Filled 2016-11-13 (×2): qty 0.4

## 2016-11-13 MED ORDER — SODIUM CHLORIDE 0.9 % IV BOLUS (SEPSIS)
1000.0000 mL | Freq: Once | INTRAVENOUS | Status: AC
  Administered 2016-11-13: 1000 mL via INTRAVENOUS

## 2016-11-13 MED ORDER — TIOTROPIUM BROMIDE MONOHYDRATE 18 MCG IN CAPS: 18.0000 ug | ORAL_CAPSULE | Freq: Every day | RESPIRATORY_TRACT | Status: DC

## 2016-11-13 MED ORDER — ONDANSETRON HCL 4 MG PO TABS: 4.0000 mg | ORAL_TABLET | Freq: Four times a day (QID) | ORAL | Status: DC | PRN

## 2016-11-13 MED ORDER — ESCITALOPRAM OXALATE 20 MG PO TABS: 20.0000 mg | ORAL_TABLET | Freq: Every day | ORAL | Status: DC

## 2016-11-13 MED ORDER — MOMETASONE FURO-FORMOTEROL FUM 200-5 MCG/ACT IN AERO
2.0000 | INHALATION_SPRAY | Freq: Two times a day (BID) | RESPIRATORY_TRACT | Status: DC
Start: 2016-11-13 — End: 2016-11-15
  Administered 2016-11-13 – 2016-11-15 (×4): 2 via RESPIRATORY_TRACT
  Filled 2016-11-13: qty 8.8

## 2016-11-13 MED ORDER — ONDANSETRON HCL 4 MG/2ML IJ SOLN: 4.0000 mg | Freq: Four times a day (QID) | INTRAMUSCULAR | Status: DC | PRN

## 2016-11-13 MED ORDER — SODIUM CHLORIDE 0.9 % IV SOLN
1.0000 g | Freq: Once | INTRAVENOUS | Status: AC
  Administered 2016-11-13: 1 g via INTRAVENOUS
  Filled 2016-11-13: qty 10

## 2016-11-13 MED ORDER — ACETAMINOPHEN 650 MG RE SUPP: 650.0000 mg | Freq: Four times a day (QID) | RECTAL | Status: DC | PRN

## 2016-11-13 MED ORDER — ATORVASTATIN CALCIUM 20 MG PO TABS
20.0000 mg | ORAL_TABLET | Freq: Every evening | ORAL | Status: DC
  Administered 2016-11-13: 20 mg via ORAL
  Filled 2016-11-13: qty 1

## 2016-11-13 MED ORDER — ACETAMINOPHEN 325 MG PO TABS: 650.0000 mg | ORAL_TABLET | Freq: Four times a day (QID) | ORAL | Status: DC | PRN

## 2016-11-13 MED ORDER — ACETAMINOPHEN 500 MG PO TABS
1000.0000 mg | ORAL_TABLET | Freq: Once | ORAL | Status: AC
  Administered 2016-11-13: 1000 mg via ORAL

## 2016-11-13 MED ORDER — PANTOPRAZOLE SODIUM 40 MG PO TBEC: 40.0000 mg | DELAYED_RELEASE_TABLET | Freq: Every day | ORAL | Status: DC

## 2016-11-13 MED ORDER — ACETAMINOPHEN 500 MG PO TABS
ORAL_TABLET | ORAL | Status: AC
  Administered 2016-11-13: 1000 mg via ORAL
  Filled 2016-11-13: qty 2

## 2016-11-13 MED ORDER — SODIUM CHLORIDE 0.9 % IV SOLN
INTRAVENOUS | Status: DC
Start: 2016-11-13 — End: 2016-11-14
  Administered 2016-11-13: 75 mL/h via INTRAVENOUS

## 2016-11-13 MED ORDER — RISPERIDONE 0.25 MG PO TABS: 0.2500 mg | ORAL_TABLET | Freq: Every day | ORAL | Status: DC

## 2016-11-13 NOTE — ED Notes (Signed)
Unable to obtain stool sample at this time.

## 2016-11-13 NOTE — ED Triage Notes (Addendum)
Per EMS report, [facility] and has had diarrhea since last night. Patient c/o feeling weak this morning. EMS obtained a B/P of 52/30. Patient states she is currently on antibiotics, but doesn't know the name.

## 2016-11-13 NOTE — ED Notes (Signed)
NS infusing without difficulty. Second NS bolus deferred until first bag is finished per Dr. Don PerkingVeronese.

## 2016-11-13 NOTE — H&P (Signed)
[facility] [place] at [facility]   PATIENT NAME: Allison Holland    MR#:  [id number]  DATE OF BIRTH:  [birth year]  DATE OF ADMISSION:  11/13/2016  PRIMARY CARE PHYSICIAN: Patient, No Pcp Per   REQUESTING/REFERRING PHYSICIAN: Don Perking, MD  CHIEF COMPLAINT:   Chief Complaint  Patient presents with  . Diarrhea    HISTORY OF PRESENT ILLNESS:  Allison Holland  is a 81 y.o. female who presents with Significant white or diarrhea. Patient states that this diarrhea started within the last day or so. She does not recall any recent antibiotic usage. She denies any abdominal pain. She denies any blood in her stool. She denies any nausea or vomiting. She denies any fever or chills. Per EMS report her blood pressure was systolic 50s when they arrived to pick her up. Her main complaint was weakness and falls. With significant IV fluid administration the ED her blood pressure has somewhat normalize, but remains borderline low. Hospitalists were called for admission  PAST MEDICAL HISTORY:   Past Medical History:  Diagnosis Date  . Arthritis    knees, hips  . Asthma   . COPD (chronic obstructive pulmonary disease) (HCC)   . Dementia    mild  . Depression   . Family history of adverse reaction to anesthesia    daughter - BP drops  . History of bleeding ulcers    past 2 years  . Hypercholesterolemia   . Hypertension    in the past  . Osteoporosis   . Shortness of breath dyspnea   . Thyroid nodule    possible  . Wears dentures    full upper and lower    PAST SURGICAL HISTORY:   Past Surgical History:  Procedure Laterality Date  . CATARACT EXTRACTION W/PHACO Right 01/14/2016   Procedure: CATARACT EXTRACTION PHACO AND INTRAOCULAR LENS PLACEMENT (IOC);  Surgeon: Lockie Mola, MD;  Location: [facility];  Service: Ophthalmology;  Laterality: Right;  . CATARACT EXTRACTION W/PHACO Left 02/18/2016   Procedure: CATARACT EXTRACTION PHACO AND  INTRAOCULAR LENS PLACEMENT (IOC);  Surgeon: Lockie Mola, MD;  Location: [facility];  Service: Ophthalmology;  Laterality: Left;  . CHOLECYSTECTOMY    . ORIF WRIST FRACTURE      SOCIAL HISTORY:   Social History  Substance Use Topics  . Smoking status: Former Smoker    Quit date: 02/06/2015  . Smokeless tobacco: Never Used  . Alcohol use No    FAMILY HISTORY:   Family History  Problem Relation Age of Onset  . Stroke Mother   . Diabetes Father   . Hypertension Father   . Aortic aneurysm Sister     DRUG ALLERGIES:  No Known Allergies  MEDICATIONS AT HOME:   Prior to Admission medications   Medication Sig Start Date End Date Taking? Authorizing Provider  acetaminophen (TYLENOL) 325 MG tablet Take 650 mg by mouth every 6 (six) hours as needed.    [provider]  albuterol (PROVENTIL HFA;VENTOLIN HFA) 108 (90 Base) MCG/ACT inhaler Inhale 2 puffs into the lungs every 4 (four) hours as needed for wheezing or shortness of breath.    [provider]  alendronate (FOSAMAX) 70 MG tablet Take 70 mg by mouth once a week. Take with a full glass of water on an empty stomach.    [provider]  atorvastatin (LIPITOR) 20 MG tablet Take 20 mg by mouth daily.    [provider]  budesonide-formoterol (SYMBICORT) 160-4.5 MCG/ACT inhaler Inhale 2  puffs into the lungs 2 (two) times daily.    [provider]  calcium carbonate (OSCAL) 1500 (600 Ca) MG TABS tablet Take 600 mg of elemental calcium by mouth 2 (two) times daily with a meal.    [provider]  escitalopram (LEXAPRO) 20 MG tablet Take 20 mg by mouth daily.    [provider]  Multiple Vitamins-Minerals (CENTRUM SILVER) CHEW Chew 1 tablet by mouth.    [provider]  pantoprazole (PROTONIX) 40 MG tablet Take 40 mg by mouth daily.    [provider]  Pseudoephedrine-Guaifenesin [phone] MG TB12 Take 1 tablet by mouth every 12 (twelve) hours  as needed.    [provider]  risperiDONE (RISPERDAL) 0.25 MG tablet Take 0.25 mg by mouth at bedtime.    [provider]  tiotropium (SPIRIVA) 18 MCG inhalation capsule Place 18 mcg into inhaler and inhale daily.    [provider]    REVIEW OF SYSTEMS:  Review of Systems  Constitutional: Negative for chills, fever, malaise/fatigue and weight loss.  HENT: Negative for ear pain, hearing loss and tinnitus.   Eyes: Negative for blurred vision, double vision, pain and redness.  Respiratory: Negative for cough, hemoptysis and shortness of breath.   Cardiovascular: Negative for chest pain, palpitations, orthopnea and leg swelling.  Gastrointestinal: Positive for diarrhea. Negative for abdominal pain, constipation, nausea and vomiting.  Genitourinary: Negative for dysuria, frequency and hematuria.  Musculoskeletal: Negative for back pain, joint pain and neck pain.  Skin:       No acne, rash, or lesions  Neurological: Positive for weakness. Negative for dizziness, tremors and focal weakness.  Endo/Heme/Allergies: Negative for polydipsia. Does not bruise/bleed easily.  Psychiatric/Behavioral: Negative for depression. The patient is not nervous/anxious and does not have insomnia.      VITAL SIGNS:   Vitals:   11/13/16 1500 11/13/16 1530 11/13/16 1531 11/13/16 1645  BP: (!) 100/51 118/68  108/61  Pulse: 87 (!) 165 93 88  Resp: 18 (!) 21 (!) 21 (!) 28  Temp:      TempSrc:      SpO2: 99% (!) 89% 98% 99%  Weight:       Wt Readings from Last 3 Encounters:  11/13/16 57.5 kg (126 lb 12.8 oz)  02/18/16 62.6 kg (138 lb)  01/14/16 61.2 kg (135 lb)    PHYSICAL EXAMINATION:  Physical Exam  Vitals reviewed. Constitutional: She is oriented to person, place, and time. She appears well-developed and well-nourished. No distress.  HENT:  Head: Normocephalic and atraumatic.  Dry mucous membranes  Eyes: Conjunctivae and EOM are normal. Pupils are equal, round, and  reactive to light. No scleral icterus.  Neck: Normal range of motion. Neck supple. No JVD present. No thyromegaly present.  Cardiovascular: Normal rate, regular rhythm and intact distal pulses.  Exam reveals no gallop and no friction rub.   No murmur heard. Respiratory: Effort normal and breath sounds normal. No respiratory distress. She has no wheezes. She has no rales.  GI: Soft. Bowel sounds are normal. She exhibits no distension. There is no tenderness.  Musculoskeletal: Normal range of motion. She exhibits no edema.  No arthritis, no gout  Lymphadenopathy:    She has no cervical adenopathy.  Neurological: She is alert and oriented to person, place, and time. No cranial nerve deficit.  No dysarthria, no aphasia  Skin: Skin is warm and dry. No rash noted. No erythema.  Psychiatric: She has a normal mood and affect. Her behavior is  normal. Judgment and thought content normal.    LABORATORY PANEL:   CBC  Recent Labs Lab 11/13/16 1155  WBC 9.7  HGB 13.6  HCT 39.2  PLT 168   ------------------------------------------------------------------------------------------------------------------  Chemistries   Recent Labs Lab 11/13/16 1155 11/13/16 1454  NA 137 139  K 3.4* 3.3*  CL 102 108  CO2 27 24  GLUCOSE 160* 121*  BUN 31* 28*  CREATININE 1.21* 0.92  CALCIUM 8.5* 7.7*  MG 1.7  --    ------------------------------------------------------------------------------------------------------------------  Cardiac Enzymes No results for input(s): TROPONINI in the last 168 hours. ------------------------------------------------------------------------------------------------------------------  RADIOLOGY:  Dg Pelvis 1-2 Views  Result Date: 11/13/2016 CLINICAL DATA:  Recent falls. EXAM: PELVIS - 1-2 VIEW COMPARISON:  None. FINDINGS: There is a fracture of the inferior right pubic ramus. There appears to be callus formation suggesting it is chronic. The proximal femurs are intact  with no evidence of hip fracture on single AP views. The left-sided pelvic bones are normal in appearance. The right superior pubic ramus is not well assessed due to patient rotation. If there is concern in this region, a repeat film without rotation could further evaluate. No other abnormalities. IMPRESSION: 1. No hip fractures identified. 2. The right inferior pubic ramus fracture with callus formation is chronic. 3. The right superior pubic ramus, particularly as it approaches the right acetabulum, is not well assessed due to patient rotation. If there is concern in this region, a CT scan or repeat nonrotated x-ray could be performed. 4. No other abnormalities. Electronically Signed   By: Gerome Samavid  Williams III M.D   On: 11/13/2016 17:08   Ct Head Wo Contrast  Result Date: 11/13/2016 CLINICAL DATA:  81 year old female with a history of fall EXAM: CT HEAD WITHOUT CONTRAST TECHNIQUE: Contiguous axial images were obtained from the base of the skull through the vertex without intravenous contrast. COMPARISON:  None. FINDINGS: Brain: No acute intracranial hemorrhage. No midline shift or mass effect. Mild volume loss. Unremarkable configuration the ventricles. Minimal hypodensity in the periventricular white matter. Gray-white differentiation maintained. Vascular: Calcifications of the intracranial vasculature. Skull: No displaced fracture.  No aggressive bone lesions. Sinuses/Orbits: Unremarkable appearance of the orbits. Unremarkable paranasal sinuses. Other: None IMPRESSION: No CT evidence of acute intracranial abnormality. Chronic microvascular ischemic disease and associated intracranial atherosclerosis Electronically Signed   By: Gilmer MorJaime  Wagner D.O.   On: 11/13/2016 12:40    EKG:   Orders placed or performed during the hospital encounter of 11/13/16  . EKG 12-Lead  . EKG 12-Lead  . EKG 12-Lead  . EKG 12-Lead  . ED EKG  . ED EKG    IMPRESSION AND PLAN:  Principal Problem:   Diarrhea - patient is C.  difficile negative, GI panel pending. Unclear etiology for her diarrhea this time, though viral pathology is suspected. She significant dehydrated, we will maintain IV fluids tonight, follow up on GI panel. Active Problems:   HTN (hypertension) - hold home antihypertensives for now as she is borderline hypotensive   COPD (chronic obstructive pulmonary disease) (HCC) - continue home inhalers   Depression - home dose antidepressant   GERD (gastroesophageal reflux disease) - home dose PPI  All the records are reviewed and case discussed with ED provider. Management plans discussed with the patient and/or family.  DVT PROPHYLAXIS: SubQ lovenox  GI PROPHYLAXIS: PPI  ADMISSION STATUS: Observation  CODE STATUS: Full Code Status History    This patient does not have a recorded code status. Please follow your organizational policy for patients  in this situation.    Advance Directive Documentation     Most Recent Value  Type of Advance Directive  Out of facility DNR (pink MOST or yellow form), Healthcare Power of Attorney  Pre-existing out of facility DNR order (yellow form or pink MOST form)  -  "MOST" Form in Place?  -      TOTAL TIME TAKING CARE OF THIS PATIENT: 40 minutes.   Derrek Puff FIELDING 11/13/2016, 6:53 PM  [facility]  Office  [phone]  CC: Primary care physician; Patient, No Pcp Per  Note:  This document was prepared using Dragon voice recognition software and may include unintentional dictation errors.

## 2016-11-13 NOTE — ED Notes (Signed)
Patient placed on bedpan but unable to void at this time.  Diaper shows no signs of diarrhea.

## 2016-11-13 NOTE — ED Notes (Signed)
XR called to come get patient for pelvis scan

## 2016-11-13 NOTE — ED Notes (Signed)
Dr. Don PerkingVeronese informed that patient had dislodged the NS tubing and an unknown amount of NS did not infused. Patient was changed into a dry gown, and linens were changed. No stool noted in brief.

## 2016-11-13 NOTE — ED Notes (Signed)
MD at bedside, patient states her hip is hurting from multiple falls [facility] has [facility].  Patient is also hypotensive at dc, MD ordering Tylenol for patient's pain relief, and NS 0.9 bolus.

## 2016-11-13 NOTE — ED Notes (Signed)
Patient's bed is wet and a large puddle is on the floor. Patient states she has been moving and might have dislodged the IV tubing. Patient took off blood pressure cuff also.

## 2016-11-13 NOTE — ED Notes (Addendum)
Patient had blood pressure cuff and O2 off. Sats at 88% on room air. O2 replaced via [place], sats rose to 96% on 2L. No stool found in brief.

## 2016-11-13 NOTE — ED Provider Notes (Addendum)
[facility] Emergency Department Provider Note  ____________________________________________  Time seen: Approximately 12:14 PM  I have reviewed the triage vital signs and the nursing notes.   HISTORY  Chief Complaint Diarrhea  Level 5 caveat:  Portions of the history and physical were unable to be obtained due to dementia   HPI Allison Holland is a 81 y.o. female with a history of dementia, COPD on 2 L nasal cannula, hypertension, hyperlipidemia who presents for evaluation of diarrhea. Patient reports diarrhea since last night. Has had 4 episodes of watery diarrhea since this morning.Patient reports dark brown stools initially. She has had a history of GI bleeds in the past. She is not on blood thinners.  She reports being currently on abx but unsure which one. No prior h/o C. Diff. Patient reports that she has had 2 falls today because she feels very weak since the diarrhea started. She does not remember if she hit her head. She denies LOC. She denies hurting herself with any of these falls. She does have a walker that she uses at home. She denies abdominal pain, chest pain, shortness of breath, neck pain, back pain, headache, nausea, vomiting. Per EMS patient's BP was 52/30 upon arrival to [facility].  Past Medical History:  Diagnosis Date  . Arthritis    knees, hips  . Asthma   . COPD (chronic obstructive pulmonary disease) (HCC)   . Dementia    mild  . Depression   . Family history of adverse reaction to anesthesia    daughter - BP drops  . History of bleeding ulcers    past 2 years  . Hypercholesterolemia   . Hypertension    in the past  . Osteoporosis   . Shortness of breath dyspnea   . Thyroid nodule    possible  . Wears dentures    full upper and lower    There are no active problems to display for this patient.   Past Surgical History:  Procedure Laterality Date  . CATARACT EXTRACTION W/PHACO Right 01/14/2016   Procedure:  CATARACT EXTRACTION PHACO AND INTRAOCULAR LENS PLACEMENT (IOC);  Surgeon: Lockie Mola, MD;  Location: [facility];  Service: Ophthalmology;  Laterality: Right;  . CATARACT EXTRACTION W/PHACO Left 02/18/2016   Procedure: CATARACT EXTRACTION PHACO AND INTRAOCULAR LENS PLACEMENT (IOC);  Surgeon: Lockie Mola, MD;  Location: [facility];  Service: Ophthalmology;  Laterality: Left;  . CHOLECYSTECTOMY    . ORIF WRIST FRACTURE      Prior to Admission medications   Medication Sig Start Date End Date Taking? Authorizing Provider  acetaminophen (TYLENOL) 325 MG tablet Take 650 mg by mouth every 6 (six) hours as needed.    [provider]  albuterol (PROVENTIL HFA;VENTOLIN HFA) 108 (90 Base) MCG/ACT inhaler Inhale 2 puffs into the lungs every 4 (four) hours as needed for wheezing or shortness of breath.    [provider]  alendronate (FOSAMAX) 70 MG tablet Take 70 mg by mouth once a week. Take with a full glass of water on an empty stomach.    [provider]  atorvastatin (LIPITOR) 20 MG tablet Take 20 mg by mouth daily.    [provider]  budesonide-formoterol (SYMBICORT) 160-4.5 MCG/ACT inhaler Inhale 2 puffs into the lungs 2 (two) times daily.    [provider]  calcium carbonate (OSCAL) 1500 (600 Ca) MG TABS tablet Take 600 mg of elemental calcium by mouth 2 (two) times daily with a meal.  [provider]  escitalopram (LEXAPRO) 20 MG tablet Take 20 mg by mouth daily.    [provider]  Multiple Vitamins-Minerals (CENTRUM SILVER) CHEW Chew 1 tablet by mouth.    [provider]  pantoprazole (PROTONIX) 40 MG tablet Take 40 mg by mouth daily.    [provider]  Pseudoephedrine-Guaifenesin 713 001 6434 MG TB12 Take 1 tablet by mouth every 12 (twelve) hours as needed.    [provider]  risperiDONE (RISPERDAL) 0.25 MG tablet Take 0.25 mg by mouth at bedtime.    [provider]   tiotropium (SPIRIVA) 18 MCG inhalation capsule Place 18 mcg into inhaler and inhale daily.    [provider]    Allergies Patient has no known allergies.  No family history on file.  Social History Social History  Substance Use Topics  . Smoking status: Former Smoker    Quit date: 02/06/2015  . Smokeless tobacco: Never Used  . Alcohol use No    Review of Systems  Constitutional: Negative for fever. + generalized weakness Eyes: Negative for visual changes. ENT: Negative for sore throat. Neck: No neck pain  Cardiovascular: Negative for chest pain. Respiratory: Negative for shortness of breath. Gastrointestinal: Negative for abdominal pain, vomiting. + diarrhea Genitourinary: Negative for dysuria. Musculoskeletal: Negative for back pain. Skin: Negative for rash. Neurological: Negative for headaches, weakness or numbness. Psych: No SI or HI  ____________________________________________   PHYSICAL EXAM:  VITAL SIGNS: ED Triage Vitals  Enc Vitals Group     BP 11/13/16 1205 (!) 87/50     Pulse Rate 11/13/16 1205 92     Resp 11/13/16 1205 20     Temp 11/13/16 1205 98.6 F (37 C)     Temp Source 11/13/16 1205 Oral     SpO2 11/13/16 1205 100 %     Weight 11/13/16 1206 126 lb 12.8 oz (57.5 kg)     Height --      Head Circumference --      Peak Flow --      Pain Score --      Pain Loc --      Pain Edu? --      Excl. in GC? --    Constitutional: Alert and oriented. No acute distress. Does not appear intoxicated. HEENT Head: Normocephalic and atraumatic. Face: No facial bony tenderness. Stable midface Ears: No hemotympanum bilaterally. No Battle sign Eyes: No eye injury. PERRL. No raccoon eyes Nose: Nontender. No epistaxis. No rhinorrhea Mouth/Throat: Mucous membranes are moist. No oropharyngeal blood. No dental injury. Airway patent without stridor. Normal voice. Neck: no C-collar in place. No midline c-spine tenderness.  Cardiovascular: Normal rate,  regular rhythm. Normal and symmetric distal pulses are present in all extremities. Pulmonary/Chest: Chest wall is stable and nontender to palpation/compression. Normal respiratory effort. Breath sounds are normal. No crepitus.  Abdominal: Soft, nontender, non distended.Rectal exam showing light brown stool guaiac negative  Musculoskeletal: Nontender with normal full range of motion in all extremities. No deformities. No thoracic or lumbar midline spinal tenderness. Pelvis is stable. Skin: Skin is warm, dry and intact. No abrasions or contutions. Psychiatric: Speech and behavior are appropriate. Neurological: Normal speech and language. Moves all extremities to command. No gross focal neurologic deficits are appreciated.  Glascow Coma Score: 4 - Opens eyes on own 6 - Follows simple motor commands 5 - Alert and oriented GCS: 15   ____________________________________________   LABS (all labs ordered are listed, but only abnormal results are displayed)  Labs Reviewed  CBC WITH DIFFERENTIAL/PLATELET - Abnormal; Notable for the following:       Result Value   Neutro Abs 9.2 (*)    Lymphs Abs 0.2 (*)    All other components within normal limits  BASIC METABOLIC PANEL - Abnormal; Notable for the following:    Potassium 3.4 (*)    Glucose, Bld 160 (*)    BUN 31 (*)    Creatinine, Ser 1.21 (*)    Calcium 8.5 (*)    GFR calc non Af Amer 41 (*)    GFR calc Af Amer 48 (*)    All other components within normal limits  BASIC METABOLIC PANEL - Abnormal; Notable for the following:    Potassium 3.3 (*)    Glucose, Bld 121 (*)    BUN 28 (*)    Calcium 7.7 (*)    GFR calc non Af Amer 58 (*)    All other components within normal limits  C DIFFICILE QUICK SCREEN W PCR REFLEX  MAGNESIUM   ____________________________________________  EKG  ED ECG REPORT I, Nita Sicklearolina Aakash Hollomon, the attending physician, personally viewed and interpreted this ECG.  Normal sinus rhythm, rate of 91, normal  intervals, left axis deviation, no ST elevations or depressions, diffuse ST-T wave abnormalities. No prior for comparison ____________________________________________  RADIOLOGY  Head CT; No CT evidence of acute intracranial abnormality.  Chronic microvascular ischemic disease and associated intracranial atherosclerosis ____________________________________________   PROCEDURES  Procedure(s) performed: None Procedures Critical Care performed:  None ____________________________________________   INITIAL IMPRESSION / ASSESSMENT AND PLAN / ED COURSE  81 y.o. female with a history of dementia, COPD on 2 L nasal cannula, hypertension, hyperlipidemia who presents for evaluation of diarrhea since last night, generalized weakness and two falls. No injuries on exam and per history from the falls. Rectal exam showing guaiac negative stools. Vitals showing hypotension with BP 87/50, patient looks dry on exam. Abdomen is soft and non tender. Will give IVF, check CBC, BMP, C. Diff. Will monitor on telemetry. Will get head CT since patient had two falls and has dementia to rule out intracranial injury, no signs or symptoms of basilar skull fracture.   Clinical Course as of Nov 14 1818  Sat Nov 13, 2016  1608 At 1530 vitals were documented with HR 165 and O2 of 89%. I asked the nurse about these vitals and was told that those were incorrect as patient as moving around, had removed telemetry leads and the repeat vitals one minute later were the correct ones.   [CV]  1818 Kidney function improved after IV fluids however patient remains persistently hypotensive after 2 L of normal saline. She had one watery bowel movement in the emergency room which was sent for analysis. C. difficile is pending. We'll consult the hospitalist for admission at this time.  [CV]    Clinical Course User Index [CV] Nita SickleVeronese, [place], MD      Pertinent labs & imaging results that were available during my care of the  patient were reviewed by me and considered in my medical decision making (see chart for details).    ____________________________________________   FINAL CLINICAL IMPRESSION(S) / ED DIAGNOSES  Final diagnoses:  Diarrhea of presumed infectious origin  AKI (acute kidney injury) (HCC)  Dehydration  Gastroenteritis      NEW MEDICATIONS STARTED DURING THIS VISIT:  New Prescriptions   No medications on file     Note:  This document was prepared using Dragon voice recognition software and may include unintentional dictation  errors.    Don Perking, [place], MD 11/13/16 1611    Nita Sickle, MD 11/13/16 Zollie Pee

## 2016-11-14 DIAGNOSIS — E860 Dehydration: Secondary | ICD-10-CM | POA: Diagnosis not present

## 2016-11-14 LAB — URINALYSIS, COMPLETE (UACMP) WITH MICROSCOPIC
BILIRUBIN URINE: NEGATIVE
Glucose, UA: NEGATIVE mg/dL
Ketones, ur: NEGATIVE mg/dL
Nitrite: NEGATIVE
Protein, ur: NEGATIVE mg/dL
Specific Gravity, Urine: 1.012 (ref 1.005–1.030)
pH: 5 (ref 5.0–8.0)

## 2016-11-14 LAB — BASIC METABOLIC PANEL
Anion gap: 4 — ABNORMAL LOW (ref 5–15)
BUN: 25 mg/dL — AB (ref 6–20)
CO2: 25 mmol/L (ref 22–32)
CREATININE: 0.83 mg/dL (ref 0.44–1.00)
Calcium: 7.9 mg/dL — ABNORMAL LOW (ref 8.9–10.3)
Chloride: 111 mmol/L (ref 101–111)
GFR calc Af Amer: >60 mL/min (ref >60)
GFR calc non Af Amer: >60 mL/min (ref >60)
GLUCOSE: 111 mg/dL — AB (ref 65–99)
Potassium: 3.1 mmol/L — ABNORMAL LOW (ref 3.5–5.1)
SODIUM: 140 mmol/L (ref 135–145)

## 2016-11-14 LAB — CBC
HCT: 31.9 % — ABNORMAL LOW (ref 35.0–47.0)
Hemoglobin: 11 g/dL — ABNORMAL LOW (ref 12.0–16.0)
MCH: 33.5 pg (ref 26.0–34.0)
MCHC: 34.5 g/dL (ref 32.0–36.0)
MCV: 97 fL (ref 80.0–100.0)
PLATELETS: 129 10*3/uL — AB (ref 150–440)
RBC: 3.29 MIL/uL — ABNORMAL LOW (ref 3.80–5.20)
RDW: 13.7 % (ref 11.5–14.5)
WBC: 4.5 10*3/uL (ref 3.6–11.0)

## 2016-11-14 LAB — MAGNESIUM: Magnesium: 1.6 mg/dL — ABNORMAL LOW (ref 1.7–2.4)

## 2016-11-14 LAB — MRSA PCR SCREENING: MRSA by PCR: NEGATIVE

## 2016-11-14 MED ORDER — BUTALBITAL-APAP-CAFFEINE 50-325-40 MG PO TABS: 1.0000 | ORAL_TABLET | ORAL | Status: DC | PRN

## 2016-11-14 MED ORDER — DIVALPROEX SODIUM 250 MG PO DR TAB
250.0000 mg | DELAYED_RELEASE_TABLET | Freq: Three times a day (TID) | ORAL | Status: DC
  Filled 2016-11-14 (×3): qty 1

## 2016-11-14 MED ORDER — ASPIRIN EC 81 MG PO TBEC: 81.0000 mg | DELAYED_RELEASE_TABLET | Freq: Every day | ORAL | Status: DC

## 2016-11-14 MED ORDER — FERROUS SULFATE 325 (65 FE) MG PO TABS: 325.0000 mg | ORAL_TABLET | Freq: Two times a day (BID) | ORAL | Status: DC

## 2016-11-14 MED ORDER — METFORMIN HCL ER 500 MG PO TB24: 500.0000 mg | ORAL_TABLET | Freq: Every day | ORAL | Status: DC

## 2016-11-14 MED ORDER — TRAZODONE HCL 50 MG PO TABS
150.0000 mg | ORAL_TABLET | Freq: Every day | ORAL | Status: DC
  Administered 2016-11-14: 02:00:00 150 mg via ORAL
  Filled 2016-11-14: qty 1

## 2016-11-14 MED ORDER — POLYETHYLENE GLYCOL 3350 17 G PO PACK: 17.0000 g | PACK | Freq: Every day | ORAL | Status: DC

## 2016-11-14 MED ORDER — MONTELUKAST SODIUM 10 MG PO TABS
10.0000 mg | ORAL_TABLET | Freq: Every day | ORAL | Status: DC
  Administered 2016-11-14: 10 mg via ORAL
  Filled 2016-11-14: qty 1

## 2016-11-14 MED ORDER — DILTIAZEM HCL ER COATED BEADS 120 MG PO CP24
120.0000 mg | ORAL_CAPSULE | Freq: Every day | ORAL | Status: DC
  Filled 2016-11-14: qty 1

## 2016-11-14 MED ORDER — CEFTRIAXONE SODIUM 1 G IJ SOLR
1.0000 g | INTRAMUSCULAR | Status: DC
  Administered 2016-11-14: 1 g via INTRAVENOUS
  Filled 2016-11-14 (×2): qty 10

## 2016-11-14 MED ORDER — ALBUTEROL SULFATE (2.5 MG/3ML) 0.083% IN NEBU: 2.5000 mg | INHALATION_SOLUTION | Freq: Four times a day (QID) | RESPIRATORY_TRACT | Status: DC | PRN

## 2016-11-14 MED ORDER — SODIUM CHLORIDE 1 G PO TABS
1.0000 g | ORAL_TABLET | Freq: Two times a day (BID) | ORAL | Status: DC
  Administered 2016-11-14 (×2): 1 g via ORAL
  Filled 2016-11-14 (×5): qty 1

## 2016-11-14 MED ORDER — CLONAZEPAM 0.5 MG PO TABS: 0.2500 mg | ORAL_TABLET | Freq: Three times a day (TID) | ORAL | Status: DC

## 2016-11-14 MED ORDER — POTASSIUM CHLORIDE CRYS ER 20 MEQ PO TBCR: 20.0000 meq | EXTENDED_RELEASE_TABLET | Freq: Every day | ORAL | Status: DC

## 2016-11-14 MED ORDER — POTASSIUM CHLORIDE IN NACL 20-0.9 MEQ/L-% IV SOLN
INTRAVENOUS | Status: DC
  Administered 2016-11-14 (×2): via INTRAVENOUS
  Filled 2016-11-14 (×7): qty 1000

## 2016-11-14 MED ORDER — POTASSIUM CHLORIDE CRYS ER 20 MEQ PO TBCR: 20.0000 meq | EXTENDED_RELEASE_TABLET | Freq: Once | ORAL | Status: DC

## 2016-11-14 MED ORDER — NICOTINE 7 MG/24HR TD PT24
7.0000 mg | MEDICATED_PATCH | Freq: Every day | TRANSDERMAL | Status: DC
  Filled 2016-11-14: qty 1

## 2016-11-14 MED ORDER — MELATONIN 5 MG PO TABS
5.0000 mg | ORAL_TABLET | Freq: Every day | ORAL | Status: DC
  Administered 2016-11-14: 5 mg via ORAL
  Filled 2016-11-14 (×2): qty 1

## 2016-11-14 MED ORDER — FUROSEMIDE 40 MG PO TABS: 40.0000 mg | ORAL_TABLET | Freq: Two times a day (BID) | ORAL | Status: DC

## 2016-11-14 MED ORDER — BUDESONIDE 0.25 MG/2ML IN SUSP
0.2500 mg | Freq: Two times a day (BID) | RESPIRATORY_TRACT | Status: DC
  Administered 2016-11-14 – 2016-11-15 (×3): 0.25 mg via RESPIRATORY_TRACT
  Filled 2016-11-14 (×3): qty 2

## 2016-11-14 MED ORDER — OLANZAPINE 5 MG PO TABS
20.0000 mg | ORAL_TABLET | Freq: Two times a day (BID) | ORAL | Status: DC
  Administered 2016-11-14: 20 mg via ORAL
  Filled 2016-11-14: qty 4

## 2016-11-14 MED ORDER — MAGNESIUM SULFATE 4 GM/100ML IV SOLN
4.0000 g | Freq: Once | INTRAVENOUS | Status: AC
Start: 2016-11-14 — End: 2016-11-14
  Administered 2016-11-14: 4 g via INTRAVENOUS
  Filled 2016-11-14: qty 100

## 2016-11-14 MED ORDER — CLONAZEPAM 0.125 MG PO TBDP
0.1250 mg | ORAL_TABLET | Freq: Three times a day (TID) | ORAL | Status: DC | PRN
Start: 2016-11-14 — End: 2016-11-14

## 2016-11-14 NOTE — Progress Notes (Signed)
Pts daughter, son and daughter in law at the bedside. Pts family stating that pt does not take Depakote, they also state that she doesn't take metformin, zyprexa, cardizem.  They are concerned that her medication list is not correct. MD Vaikute notified.  Per MD d/c all meds that pt does not take. [facility].

## 2016-11-14 NOTE — Progress Notes (Signed)
New Admission Note:   Arrival Method: per stretcher from ED, [facility] Mental Orientation: alert and oriented X4 but can be intermittently confused per daughter Dois DavenportSandra Telemetry: none ordered Assessment: Completed Skin: warm, dry with scattered bruises noted on both arms, with extra skin fold noted on the upper back, with redness noted on the sacrum. Prophylactic sacral foam dressing applied. IV: G20 on the right forearm with transparent dressing, intact, wrapped with [place] in ED. Pain: denies any pain as of this time Tubes: O2 inhalation at 2Lpm per nasal prongs-chronic Safety Measures: Safety Fall Prevention Plan has been given and discussed Admission: Completed 1A Orientation: Patient has been oriented to the room, unit and staff.  Family: daughter Dois DavenportSandra and daughter in law Stacy at bedside  Orders have been reviewed and implemented. Will continue to monitor patient. Call light has been placed within reach and bed alarm has been activated.   Janice NorrieAnessa Keanna Tugwell, RN [facility] 1A

## 2016-11-14 NOTE — Progress Notes (Signed)
[facility] [place] [facility]   PATIENT NAME: Allison MelnickBlanche Holland    MR#:  [id number]  DATE OF BIRTH:  11/30/1935  SUBJECTIVE:  CHIEF COMPLAINT:   Chief Complaint  Patient presents with  . Diarrhea   Patient is 81 year old Caucasian female with past medical history significant for history of asthma, COPD, dementia, depression, peptic ulcer disease, hypertension, hyperlipidemia, who presents to the hospital with complaints of diarrhea for the past 1-2 days. No blood was noted, no nausea or vomiting. On EMS arrival to the house, patient's systolic blood pressure was in 50s, she was weak, and apparently falling at home. She was administered IV fluids in the emergency room and her blood pressure stabilized and patient was admitted. Her stool cultures revealed Norovirus. Patient is asleep at present, unable to get review of systems Review of Systems  Unable to perform ROS: Mental acuity    VITAL SIGNS: Blood pressure 104/62, pulse 67, temperature 98.2 F (36.8 C), temperature source Axillary, resp. rate 19, height 5\' 5"  (1.651 m), weight 64.9 kg (143 lb), SpO2 92 %.  PHYSICAL EXAMINATION:   GENERAL:  81 y.o.-year-old patient lying in the bed with no acute distress, sleeping, difficult to arouse, not opening eyes, nonverbal.  EYES: Pupils equal, round, reactive to light and accommodation. No scleral icterus. Extraocular muscles intact.  HEENT: Head atraumatic, normocephalic. Oropharynx and nasopharynx clear.  NECK:  Supple, no jugular venous distention. No thyroid enlargement, no tenderness.  LUNGS: Normal breath sounds bilaterally, no wheezing, rales,rhonchi or crepitation. No use of accessory muscles of respiration.  CARDIOVASCULAR: S1, S2 normal. No murmurs, rubs, or gallops.  ABDOMEN: Soft, nontender, nondistended. Bowel sounds present. No organomegaly or mass.  EXTREMITIES: No pedal edema, cyanosis, or clubbing.  NEUROLOGIC: Cranial nerves II through XII  , . Muscle strength . Unable to examine, since patient is sleeping. Sensation unable to assess. Gait not checked.  PSYCHIATRIC: The patient is sleeping, unable to assess orientation.  SKIN: No obvious rash, lesion, or ulcer.   ORDERS/RESULTS REVIEWED:   CBC  Recent Labs Lab 11/13/16 1155 11/14/16 0331  WBC 9.7 4.5  HGB 13.6 11.0*  HCT 39.2 31.9*  PLT 168 129*  MCV 95.4 97.0  MCH 33.1 33.5  MCHC 34.7 34.5  RDW 13.3 13.7  LYMPHSABS 0.2*  --   MONOABS 0.3  --   EOSABS 0.0  --   BASOSABS 0.0  --    ------------------------------------------------------------------------------------------------------------------  Chemistries   Recent Labs Lab 11/13/16 1155 11/13/16 1454 11/14/16 0331  NA 137 139 140  K 3.4* 3.3* 3.1*  CL 102 108 111  CO2 27 24 25   GLUCOSE 160* 121* 111*  BUN 31* 28* 25*  CREATININE 1.21* 0.92 0.83  CALCIUM 8.5* 7.7* 7.9*  MG 1.7  --  1.6*   ------------------------------------------------------------------------------------------------------------------ estimated creatinine clearance is 49.5 mL/min (by C-G formula based on SCr of 0.83 mg/dL). ------------------------------------------------------------------------------------------------------------------ No results for input(s): TSH, T4TOTAL, T3FREE, THYROIDAB in the last 72 hours.  Invalid input(s): FREET3  Cardiac Enzymes No results for input(s): CKMB, TROPONINI, MYOGLOBIN in the last 168 hours.  Invalid input(s): CK ------------------------------------------------------------------------------------------------------------------ Invalid input(s): POCBNP ---------------------------------------------------------------------------------------------------------------  RADIOLOGY: Dg Pelvis 1-2 Views  Result Date: 11/13/2016 CLINICAL DATA:  Recent falls. EXAM: PELVIS - 1-2 VIEW COMPARISON:  None. FINDINGS: There is a fracture of the inferior right pubic ramus. There appears to be callus formation  suggesting it is chronic. The proximal femurs are intact with no evidence of hip fracture on single AP views.  The left-sided pelvic bones are normal in appearance. The right superior pubic ramus is not well assessed due to patient rotation. If there is concern in this region, a repeat film without rotation could further evaluate. No other abnormalities. IMPRESSION: 1. No hip fractures identified. 2. The right inferior pubic ramus fracture with callus formation is chronic. 3. The right superior pubic ramus, particularly as it approaches the right acetabulum, is not well assessed due to patient rotation. If there is concern in this region, a CT scan or repeat nonrotated x-ray could be performed. 4. No other abnormalities. Electronically Signed   By: Gerome Sam III M.D   On: 11/13/2016 17:08   Ct Head Wo Contrast  Result Date: 11/13/2016 CLINICAL DATA:  81 year old female with a history of fall EXAM: CT HEAD WITHOUT CONTRAST TECHNIQUE: Contiguous axial images were obtained from the base of the skull through the vertex without intravenous contrast. COMPARISON:  None. FINDINGS: Brain: No acute intracranial hemorrhage. No midline shift or mass effect. Mild volume loss. Unremarkable configuration the ventricles. Minimal hypodensity in the periventricular white matter. Gray-white differentiation maintained. Vascular: Calcifications of the intracranial vasculature. Skull: No displaced fracture.  No aggressive bone lesions. Sinuses/Orbits: Unremarkable appearance of the orbits. Unremarkable paranasal sinuses. Other: None IMPRESSION: No CT evidence of acute intracranial abnormality. Chronic microvascular ischemic disease and associated intracranial atherosclerosis Electronically Signed   By: Gilmer Mor D.O.   On: 11/13/2016 12:40    EKG:  Orders placed or performed during the hospital encounter of 11/13/16  . EKG 12-Lead  . EKG 12-Lead  . EKG 12-Lead  . EKG 12-Lead  . ED EKG  . ED EKG    ASSESSMENT AND  PLAN:  Principal Problem:   Diarrhea Active Problems:   HTN (hypertension)   Depression   Dementia   GERD (gastroesophageal reflux disease)   COPD (chronic obstructive pulmonary disease) (HCC) #1 Norovirus diarrhea, continue supportive therapy with fluids with potassium intravenously, antiemetics, follow clinically #2. Hypovolemic shock, improved with IV fluid administration, continue IV fluids at high rate #3. Hypokalemia, supplement intravenously, follow potassium level in the morning #4. Hypomagnesemia, supplement intravenously, follow magnesium level in the morning #5. Thrombocytopenia, likely consumption, follow platelet count in the morning #6. Altered mental state, possibly related to dementia, weakness, exhaustion, follow closely, decrease sedating medication doses #7. Generalized weakness, get physical therapist involved for recommendations, get urinalysis   Management plans discussed with the patient, family and they are in agreement.   DRUG ALLERGIES: No Known Allergies  CODE STATUS:     Code Status Orders        Start     Ordered   11/13/16 2055  Full code  Continuous     11/13/16 2055    Code Status History    Date Active Date Inactive Code Status Order ID Comments User Context   This patient has a current code status but no historical code status.    Advance Directive Documentation     Most Recent Value  Type of Advance Directive  Healthcare Power of Attorney, Out of facility DNR (pink MOST or yellow form)  Pre-existing out of facility DNR order (yellow form or pink MOST form)  Yellow form placed in chart (order not valid for inpatient use)  "MOST" Form in Place?  -      TOTAL TIME TAKING CARE OF THIS PATIENT: 40 minutes.    Katharina Caper M.D on 11/14/2016 at 11:02 AM  Between 7am to 6pm - Pager - [phone]  After  6pm go to [email or web address] - password [email or web address]  [facility]  Office  [phone]  CC: Primary care physician;  Patient, No Pcp Per

## 2016-11-14 NOTE — Progress Notes (Signed)
Received a call from lab that pt's GI panel came back positive for Norovirus. Enteric precaution isolation initiated, pt and pt's children at bedside made aware and educated.

## 2016-11-14 NOTE — Progress Notes (Signed)
MD Winona LegatoVaickute notified that pt is still sleepy. Pt was able to give me her name and state that she is at [facility], but falls back asleep. MD ordering STAT ABG's.  morning meds held.

## 2016-11-14 NOTE — [place] FL2 (Signed)
MEDICAID FL2 LEVEL OF CARE SCREENING TOOL     IDENTIFICATION  Patient Name: Allison Holland Birthdate: 12/26/1935 Sex: female Admission Date (Current Location): 11/13/2016  [place] and [place] Number:  Chiropodist and Address:  [facility], [address] [place], [place] [address]      Provider Number: [id number]  Attending Physician Name and Address:  Katharina Caper, MD  Relative Name and Phone Number:       Current Level of Care: Hospital Recommended Level of Care: Assisted Living Facility Prior Approval Number:    Date Approved/Denied:   PASRR Number:    Discharge Plan: Domiciliary (Rest home)    Current Diagnoses: Patient Active Problem List   Diagnosis Date Noted  . Diarrhea 11/13/2016  . HTN (hypertension) 11/13/2016  . Depression 11/13/2016  . Dementia 11/13/2016  . GERD (gastroesophageal reflux disease) 11/13/2016  . COPD (chronic obstructive pulmonary disease) (HCC) 11/13/2016    Orientation RESPIRATION BLADDER Height & Weight     Self, Place  O2 (2L o2) Continent Weight: 143 lb (64.9 kg) Height:  5\' 5"  (165.1 cm)  BEHAVIORAL SYMPTOMS/MOOD NEUROLOGICAL BOWEL NUTRITION STATUS      Continent    AMBULATORY STATUS COMMUNICATION OF NEEDS Skin   Limited Assist Verbally Normal                       Personal Care Assistance Level of Assistance  Bathing, Feeding, Dressing Bathing Assistance: Limited assistance Feeding assistance: Independent Dressing Assistance: Limited assistance     Functional Limitations Info             SPECIAL CARE FACTORS FREQUENCY                       Contractures      Additional Factors Info                  Current Medications (11/14/2016):  This is the current hospital active medication list Current Facility-Administered Medications  Medication Dose Route Frequency Provider Last Rate Last Dose  . 0.9 % NaCl with KCl 20 mEq/ L  infusion   Intravenous  Continuous Katharina Caper, MD 125 mL/hr at 11/14/16 0925    . acetaminophen (TYLENOL) tablet 650 mg  650 mg Oral Q6H PRN Oralia Manis, MD       Or  . acetaminophen (TYLENOL) suppository 650 mg  650 mg Rectal Q6H PRN Oralia Manis, MD      . albuterol (PROVENTIL) (2.5 MG/3ML) 0.083% nebulizer solution 2.5 mg  2.5 mg Nebulization Q6H PRN Hugelmeyer, Alexis, DO      . aspirin EC tablet 81 mg  81 mg Oral Daily Hugelmeyer, Alexis, DO      . atorvastatin (LIPITOR) tablet 20 mg  20 mg Oral QPM Oralia Manis, MD   20 mg at 11/13/16 2205  . budesonide (PULMICORT) nebulizer solution 0.25 mg  0.25 mg Nebulization BID Hugelmeyer, Alexis, DO   0.25 mg at 11/14/16 0840  . butalbital-acetaminophen-caffeine (FIORICET, ESGIC) 50-325-40 MG per tablet 1 tablet  1 tablet Oral Q4H PRN Hugelmeyer, Alexis, DO      . clonazepam (KLONOPIN) disintegrating tablet 0.125 mg  0.125 mg Oral TID PRN Katharina Caper, MD      . diltiazem (CARDIZEM CD) 24 hr capsule 120 mg  120 mg Oral Daily Hugelmeyer, Alexis, DO      . divalproex (DEPAKOTE) DR tablet 250 mg  250 mg Oral TID Hugelmeyer, Alexis, DO      .  enoxaparin (LOVENOX) injection 40 mg  40 mg Subcutaneous Q24H Oralia ManisWillis, David, MD   40 mg at 11/13/16 2205  . ferrous sulfate tablet 325 mg  325 mg Oral BID WC Hugelmeyer, Alexis, DO      . Melatonin TABS 5 mg  5 mg Oral QHS Hugelmeyer, Alexis, DO   5 mg at 11/14/16 0141  . mometasone-formoterol (DULERA) 200-5 MCG/ACT inhaler 2 puff  2 puff Inhalation BID Oralia ManisWillis, David, MD   2 puff at 11/13/16 2204  . montelukast (SINGULAIR) tablet 10 mg  10 mg Oral QHS Hugelmeyer, Alexis, DO   10 mg at 11/14/16 0141  . nicotine (NICODERM CQ - dosed in mg/24 hr) patch 7 mg  7 mg Transdermal Daily Hugelmeyer, Alexis, DO      . OLANZapine (ZYPREXA) tablet 20 mg  20 mg Oral BID Hugelmeyer, Alexis, DO   20 mg at 11/14/16 0141  . ondansetron (ZOFRAN) tablet 4 mg  4 mg Oral Q6H PRN Oralia ManisWillis, David, MD       Or  . ondansetron [facility](ZOFRAN) injection 4 mg  4 mg  Intravenous Q6H PRN Oralia ManisWillis, David, MD      . potassium chloride SA (K-DUR,KLOR-CON) CR tablet 20 mEq  20 mEq Oral Once Katharina CaperVaickute, Rima, MD      . sodium chloride tablet 1 g  1 g Oral BID Hugelmeyer, Alexis, DO   1 g at 11/14/16 0141  . traZODone (DESYREL) tablet 150 mg  150 mg Oral QHS Hugelmeyer, Alexis, DO   150 mg at 11/14/16 0141     Discharge Medications: Please see discharge summary for a list of discharge medications.  Relevant Imaging Results:  Relevant Lab Results:   Additional Information SS# [id number]  Judi CongKaren M Karstyn Birkey, LCSW

## 2016-11-14 NOTE — Clinical Social Work Note (Signed)
CSW attempted to visit the patient at bedside for assessment. The patient would not rouse and had no family at bedside. CSW attempted to contact both daughters with no answer; left voicemail for each. The [facility] with no answer. CSW will continue to attempt.  Argentina PonderKaren Martha Artemio Dobie, MSW, Theresia MajorsLCSWA [phone]

## 2016-11-14 NOTE — Progress Notes (Signed)
Physical Therapy Evaluation Patient Details Name: Allison Holland MRN: [id number] DOB: 06/12/1935 Today's Date: 11/14/2016   History of Present Illness  Patient is a 81 y.o. female admitted on 23 JUN following increasing weakness/falls. PMH includes HTN, depression, dementia, GERD, and COPD. Patient on 2L O2 during ambulation at AL.  Clinical Impression  Patient is a pleasant female admitted for above listed reasons. Patient previously living at AL on 2L oxygen and utilizing inhaler prior to ambulation. Patient on evaluation demonstrates modified independence with bed mobility but requires minimal assistance for transfers and gait. After ambulating 20', patient experienced increased wheezing, requesting inhaler. RN notified and provided with improved breathing. Oxygen saturations remained >94%. Because patient is not at baseline level of function, it is believed that she will benefit from skilled and progressive PT to return to PLOF and prevent falls with injury in the future.    Follow Up Recommendations Home health PT;Supervision for mobility/OOB    Equipment Recommendations  None recommended by PT    Recommendations for Other Services       Precautions / Restrictions Precautions Precautions: Fall Restrictions Weight Bearing Restrictions: No      Mobility  Bed Mobility Overal bed mobility: Modified Independent             General bed mobility comments: Patient performs bed mobility with ModI, utilizing bed rails.  Transfers Overall transfer level: Needs assistance Equipment used: Rolling walker (2 wheeled) Transfers: Sit to/from Stand Sit to Stand: Min assist         General transfer comment: Patient moves from sit to stand and stand to sit with minimal assistance and verbal/tactile cues for proper sequencing.  Ambulation/Gait Ambulation/Gait assistance: Min guard Ambulation Distance (Feet): 20 Feet Assistive device: Rolling walker (2 wheeled)       General  Gait Details: Patient ambulates at decreased cadence with forward trunk lean, utilizing RW. Upon completion of walking task, patient experienced wheezing, requesting inhaler. RN notified. Oxygen saturations remained >94%.,  Stairs            Wheelchair Mobility    Modified Rankin (Stroke Patients Only)       Balance Overall balance assessment: Needs assistance;History of Falls Sitting-balance support: Feet supported Sitting balance-Leahy Scale: Good     Standing balance support: Bilateral upper extremity supported Standing balance-Leahy Scale: Good                               Pertinent Vitals/Pain Pain Assessment: No/denies pain    Home Living Family/patient expects to be discharged to:: Assisted living               Home Equipment: Walker - 4 wheels;Shower seat      Prior Function Level of Independence: Needs assistance   Gait / Transfers Assistance Needed: Performed ModI with rollator for household distances on 2L O2  ADL's / Homemaking Assistance Needed: Performed by AL        Hand Dominance        Extremity/Trunk Assessment   Upper Extremity Assessment Upper Extremity Assessment: Generalized weakness    Lower Extremity Assessment Lower Extremity Assessment: Generalized weakness       Communication   Communication: No difficulties  Cognition Arousal/Alertness: Lethargic Behavior During Therapy: WFL for tasks assessed/performed Overall Cognitive Status: History of cognitive impairments - at baseline  General Comments      Exercises     Assessment/Plan    PT Assessment Patient needs continued PT services  PT Problem List Decreased strength;Decreased activity tolerance;Decreased balance;Decreased mobility;Decreased cognition;Decreased knowledge of use of DME;Decreased safety awareness;Cardiopulmonary status limiting activity       PT Treatment Interventions DME  instruction;Gait training;Functional mobility training;Therapeutic activities;Therapeutic exercise;Balance training;Cognitive remediation;Patient/family education    PT Goals (Current goals can be found in the Care Plan section)  Acute Rehab PT Goals Patient Stated Goal: Unstated PT Goal Formulation: With patient/family Time For Goal Achievement: 11/28/16 Potential to Achieve Goals: Good    Frequency Min 2X/week   Barriers to discharge        Co-evaluation               AM-PAC PT "6 Clicks" Daily Activity  Outcome Measure Difficulty turning over in bed (including adjusting bedclothes, sheets and blankets)?: None Difficulty moving from lying on back to sitting on the side of the bed? : A Little Difficulty sitting down on and standing up from a chair with arms (e.g., wheelchair, bedside commode, etc,.)?: A Little Help needed moving to and from a bed to chair (including a wheelchair)?: A Little Help needed walking in hospital room?: A Little Help needed climbing 3-5 steps with a railing? : A Lot 6 Click Score: 18    End of Session Equipment Utilized During Treatment: Gait belt;Oxygen Activity Tolerance: Patient tolerated treatment well;Patient limited by lethargy Patient left: in bed;with call bell/phone within reach;with bed alarm set;with family/visitor present Nurse Communication: Mobility status PT Visit Diagnosis: History of falling (Z91.81);Muscle weakness (generalized) (M62.81);Difficulty in walking, not elsewhere classified (R26.2)    Time: [id number] PT Time Calculation (min) (ACUTE ONLY): 24 min   Charges:   PT Evaluation $PT Eval Low Complexity: 1 Procedure     PT G Codes:   PT G-Codes **NOT FOR INPATIENT CLASS** Functional Assessment Tool Used: AM-PAC 6 Clicks Basic Mobility;Clinical judgement Functional Limitation: Mobility: Walking and moving around Mobility: Walking and Moving Around Current Status (V4098(G8978): At least 40 percent but less than 60 percent  impaired, limited or restricted Mobility: Walking and Moving Around Goal Status [phone](G8979): At least 20 percent but less than 40 percent impaired, limited or restricted      Neita CarpJulie Ann Reagan Behlke, PT, DPT 11/14/2016, 3:39 PM

## 2016-11-14 NOTE — Clinical Social Work Note (Signed)
Clinical Social Work Assessment  Patient Details  Name: Allison MelnickBlanche Holland MRN: [id number] Date of Birth: 06/17/1935  Date of referral:  11/14/16               Reason for consult:  Facility Placement                Permission sought to share information with:  Facility Industrial/product designerContact Representative Permission granted to share information::  Yes, Verbal Permission Granted  Name::        Agency::     Relationship::     Contact Information:     Housing/Transportation Living arrangements for the past 2 months:  Assisted Living Facility Source of Information:  Facility Patient Interpreter Needed:  None Criminal Activity/Legal Involvement Pertinent to Current Situation/Hospitalization:  No - Comment as needed Significant Relationships:  Adult Children Lives with:  Facility Resident Do you feel safe going back to the place where you live?  Yes Need for family participation in patient care:  No (Coment)  Care giving concerns:  [facility]   Social Worker assessment / plan:  CSW attempted to contact the patient's family for information as the patient is lethargic and unable to stay awake for long. The patient is oriented to person and place. The CSW was able to contact the facility for information. [facility] confirmed that the patient can return when stable, and the patient usually ambulates with 2W RW and no assistance. The patient is baseline continent of bladder and bowel.   CSW will continue to attempt contact with the family. The patient may discharge tomorrow if medically stable.  Employment status:  Retired Health and safety inspectornsurance information:  Medicare PT Recommendations:  Not assessed at this time Information / Referral to community resources:     Patient/Family's Response to care:  The patient was resting, and the family was unavailable.  Patient/Family's Understanding of and Emotional Response to Diagnosis, Current Treatment, and Prognosis:  The facility is  aware of the Norovirus diagnosis.  Emotional Assessment Appearance:  Appears stated age Attitude/Demeanor/Rapport:  Lethargic Affect (typically observed):  Stable Orientation:  Oriented to Self, Oriented to Place Alcohol / Substance use:  Never Used Psych involvement (Current and /or in the community):  No (Comment)  Discharge Needs  Concerns to be addressed:  Care Coordination, Discharge Planning Concerns Readmission within the last 30 days:  No Current discharge risk:  None Barriers to Discharge:  Continued Medical Work up   [employer] CorporationKaren M Clearnce Leja, LCSW 11/14/2016, 2:38 PM

## 2016-11-14 NOTE — Care Management Obs Status (Signed)
MEDICARE OBSERVATION STATUS NOTIFICATION   Patient Details  Name: Allison Holland MRN: [id number] Date of Birth: 19-May-1936   Medicare Observation Status Notification Given:  Yes [name]([place] letter)    [name],[name], RN 11/14/2016, 12:39 PM

## 2016-11-14 NOTE — Clinical Social Work Note (Signed)
CSW received consult that patient is from an ALF [facility] CSW will assess when able.  Argentina PonderKaren Martha Lakeith Careaga, MSW, Theresia MajorsLCSWA [phone]

## 2016-11-15 DIAGNOSIS — D696 Thrombocytopenia, unspecified: Secondary | ICD-10-CM

## 2016-11-15 DIAGNOSIS — R4182 Altered mental status, unspecified: Secondary | ICD-10-CM

## 2016-11-15 DIAGNOSIS — E860 Dehydration: Secondary | ICD-10-CM | POA: Diagnosis not present

## 2016-11-15 DIAGNOSIS — I959 Hypotension, unspecified: Secondary | ICD-10-CM

## 2016-11-15 DIAGNOSIS — E876 Hypokalemia: Secondary | ICD-10-CM

## 2016-11-15 DIAGNOSIS — R531 Weakness: Secondary | ICD-10-CM

## 2016-11-15 DIAGNOSIS — R571 Hypovolemic shock: Secondary | ICD-10-CM

## 2016-11-15 DIAGNOSIS — A0811 Acute gastroenteropathy due to Norwalk agent: Secondary | ICD-10-CM

## 2016-11-15 LAB — BLOOD GAS, ARTERIAL
Acid-base deficit: 0.3 mmol/L (ref 0.0–2.0)
BICARBONATE: 25.4 mmol/L (ref 20.0–28.0)
FIO2: 0.28
O2 Saturation: 94.3 %
PCO2 ART: 45 mmHg (ref 32.0–48.0)
PO2 ART: 75 mmHg — AB (ref 83.0–108.0)
Patient temperature: 37
pH, Arterial: 7.36 (ref 7.350–7.450)

## 2016-11-15 LAB — BASIC METABOLIC PANEL
ANION GAP: 3 — AB (ref 5–15)
BUN: 18 mg/dL (ref 6–20)
CHLORIDE: 114 mmol/L — AB (ref 101–111)
CO2: 25 mmol/L (ref 22–32)
Calcium: 7.9 mg/dL — ABNORMAL LOW (ref 8.9–10.3)
Creatinine, Ser: 0.81 mg/dL (ref 0.44–1.00)
GFR calc Af Amer: >60 mL/min (ref >60)
GFR calc non Af Amer: >60 mL/min (ref >60)
Glucose, Bld: 101 mg/dL — ABNORMAL HIGH (ref 65–99)
POTASSIUM: 3.8 mmol/L (ref 3.5–5.1)
SODIUM: 142 mmol/L (ref 135–145)

## 2016-11-15 LAB — PLATELET COUNT: Platelets: 134 10*3/uL — ABNORMAL LOW (ref 150–440)

## 2016-11-15 LAB — MAGNESIUM: MAGNESIUM: 2.1 mg/dL (ref 1.7–2.4)

## 2016-11-15 MED ORDER — CEPHALEXIN 500 MG PO CAPS: 500.0000 mg | ORAL_CAPSULE | Freq: Two times a day (BID) | ORAL | 0 refills | Status: AC

## 2016-11-15 NOTE — Care Management Note (Signed)
Case Management Note  Patient Details  Name: Allison Holland MRN: [id number] Date of Birth: [birth year]  Subjective/Objective:  Patient admitted with diarrhea and norovirus. She is from [facility]. She uses a walker at baseline. WU:[id number]:dementia. PT recommending home health PT. [facility]. Spoke with Marylene Landngela  and they use [facility]. Spoke with daughter, [name] at bedside. Discussed home health expectations and she is agreeable to home health with [facility]. Referral to [facility] made.        Action/Plan: [facility] for HHPT.    Expected Discharge Date:                  Expected Discharge Plan:  Assisted Living / Rest Home  In-House Referral:  Clinical Social Work  Discharge planning Services  CM Consult  Post Acute Care Choice:  Home Health Choice offered to:     DME Arranged:    DME Agency:     HH Arranged:  PT HH [facility]  Status of Service:  In process, will continue to follow  If discussed at Long Length of Stay Meetings, dates discussed:    Additional Comments:  Marily MemosLisa M Charmion Hapke, RN 11/15/2016, 12:25 PM

## 2016-11-15 NOTE — Progress Notes (Addendum)
Clinical Education officer, museum (CSW) contacted [facility] and spoke with med tech Levada Dy and made her aware patient is positive for the norovirus, PT is recommending home health and that patient is under observation. Per Levada Dy patient can return to [facility] when stable. CSW met with patient and her daughter Katharine Look and made them aware of above. Per daughter patient has oxygen at [facility] already set up. Daughter is agreeable for patient to return to [facility] with home health PT. CSW will continue to follow and assist as needed.   [employer], LCSW [phone]

## 2016-11-15 NOTE — Discharge Summary (Signed)
[facility] - [place] at [facility]   PATIENT NAME: Allison Holland    MR#:  [id number]  DATE OF BIRTH:  04-10-1936  DATE OF ADMISSION:  11/13/2016 ADMITTING PHYSICIAN: Oralia Manis, MD  DATE OF DISCHARGE: No discharge date for patient encounter.  PRIMARY CARE PHYSICIAN: Patient, No Pcp Per     ADMISSION DIAGNOSIS:  Diarrhea of presumed infectious origin [R19.7] Dehydration [E86.0] Gastroenteritis [K52.9] AKI (acute kidney injury) (HCC) [N17.9]  DISCHARGE DIAGNOSIS:  Principal Problem:   Diarrhea Active Problems:   HTN (hypertension)   Depression   Dementia   GERD (gastroesophageal reflux disease)   COPD (chronic obstructive pulmonary disease) (HCC)   Enteritis due to Norovirus   Hypovolemic shock (HCC)   Hypotension   Hypokalemia   Hypomagnesemia   Thrombocytopenia (HCC)   Generalized weakness   Altered mental status   SECONDARY DIAGNOSIS:   Past Medical History:  Diagnosis Date  . Arthritis    knees, hips  . Asthma   . COPD (chronic obstructive pulmonary disease) (HCC)   . Dementia    mild  . Depression   . Family history of adverse reaction to anesthesia    daughter - BP drops  . History of bleeding ulcers    past 2 years  . Hypercholesterolemia   . Hypertension    in the past  . Osteoporosis   . Shortness of breath dyspnea   . Thyroid nodule    possible  . Wears dentures    full upper and lower    .pro HOSPITAL COURSE:   Patient is 81 year old Caucasian female with past medical history significant for history of asthma, COPD, dementia, depression, peptic ulcer disease, hypertension, hyperlipidemia, who presents to the hospital with complaints of diarrhea for the past 1-2 days. No blood was noted, no nausea or vomiting. On EMS arrival to the house, patient's systolic blood pressure was in 50s, she was weak, and apparently falling at home. She was administered IV fluids in the emergency room and her blood pressure  stabilized and patient was admitted. Her stool cultures revealed Norovirus. Labs revealed hypokalemia, hypomagnesemia, mild thrombocytopenia, urinalysis showed too numerous to count white blood cells, white blood cell clumps, too numerous to count squamous epithelium cells, cultures are pending. Patient was treated symptomatically and improved clinically. Her diarrhea stopped and her diet was advanced. She was initiated on Rocephin for suspected urinary tract infection pending urine cultures. Patient was evaluated by physical therapist and recommended home health services. She was felt to be stable to be discharged home with home health services today Discussion by problem: #1 Norovirus diarrhea, resolved, continue supportive therapy  with Protonix, Imodium, follow up with primary care physician as outpatient , patient was advised to drink plenty of fluids #2. Hypovolemic shock, resolved with IV fluid administration #3. Hypokalemia, supplemented intravenously, resolved, follow closely on HCTZ, may need to be supplemented #4. Hypomagnesemia, supplemented intravenously, follow as outpatient #5. Thrombocytopenia,  platelet count has improved #6. Altered mental state due to medications, resolved #7. Generalized weakness, the patient was evaluated by physical therapist and recommended home health services, to be arranged upon discharge #8. Pyuria, suspected urinary tract infection, urine cultures are pending, continue Keflex for 2 days to complete a three-day course DISCHARGE CONDITIONS:   Stable  CONSULTS OBTAINED:    DRUG ALLERGIES:  No Known Allergies  DISCHARGE MEDICATIONS:   Current Discharge Medication List    START taking these medications   Details  cephALEXin (KEFLEX) 500 MG  capsule Take 1 capsule (500 mg total) by mouth 2 (two) times daily. Qty: 4 capsule, Refills: 0      CONTINUE these medications which have NOT CHANGED   Details  atorvastatin (LIPITOR) 20 MG tablet Take 20 mg  by mouth daily.    budesonide (PULMICORT) 0.25 MG/2ML nebulizer solution Take 0.25 mg by nebulization 2 (two) times daily.    budesonide-formoterol (SYMBICORT) 80-4.5 MCG/ACT inhaler Inhale 2 puffs into the lungs 2 (two) times daily.    calcium carbonate (CALCIUM 600) 600 MG TABS tablet Take 600 mg by mouth 2 (two) times daily with a meal.    escitalopram (LEXAPRO) 20 MG tablet Take 20 mg by mouth daily.    hydrochlorothiazide (HYDRODIURIL) 12.5 MG tablet Take 12.5 mg by mouth daily.    lisinopril (PRINIVIL,ZESTRIL) 10 MG tablet Take 10 mg by mouth daily.    Multiple Vitamins-Minerals (CERTAVITE SENIOR/ANTIOXIDANT) TABS Take 1 tablet by mouth daily.    pantoprazole (PROTONIX) 40 MG tablet Take 40 mg by mouth daily.    risperiDONE (RISPERDAL) 0.25 MG tablet Take 0.25 mg by mouth at bedtime.    tiotropium (SPIRIVA) 18 MCG inhalation capsule Place 18 mcg into inhaler and inhale daily.    Acetaminophen 500 MG coapsule Take 650 mg by mouth every 6 (six) hours as needed.    albuterol (PROVENTIL HFA;VENTOLIN HFA) 108 (90 Base) MCG/ACT inhaler Inhale 2 puffs into the lungs every 4 (four) hours as needed for wheezing or shortness of breath.    guaiFENesin (MUCINEX) 600 MG 12 hr tablet Take by mouth 2 (two) times daily.    loperamide (IMODIUM) 2 MG capsule Take by mouth as needed for diarrhea or loose stools.      STOP taking these medications     budesonide-formoterol (SYMBICORT) 160-4.5 MCG/ACT inhaler          DISCHARGE INSTRUCTIONS:    The patient is to follow-up with primary care physician within one week after discharge  If you experience worsening of your admission symptoms, develop shortness of breath, life threatening emergency, suicidal or homicidal thoughts you must seek medical attention immediately by calling 911 or calling your MD immediately  if symptoms less severe.  You Must read complete instructions/literature along with all the possible adverse reactions/side  effects for all the Medicines you take and that have been prescribed to you. Take any new Medicines after you have completely understood and accept all the possible adverse reactions/side effects.   Please note  You were cared for by a hospitalist during your hospital stay. If you have any questions about your discharge medications or the care you received while you were in the hospital after you are discharged, you can call the unit and asked to speak with the hospitalist on call if the hospitalist that took care of you is not available. Once you are discharged, your primary care physician will handle any further medical issues. Please note that NO REFILLS for any discharge medications will be authorized once you are discharged, as it is imperative that you return to your primary care physician (or establish a relationship with a primary care physician if you do not have one) for your aftercare needs so that they can reassess your need for medications and monitor your lab values.    Today   CHIEF COMPLAINT:   Chief Complaint  Patient presents with  . Diarrhea    HISTORY OF PRESENT ILLNESS:     VITAL SIGNS:  Blood pressure 113/71, pulse 87, temperature 97.7  F (36.5 C), temperature source Oral, resp. rate 18, height 5\' 5"  (1.651 m), weight 64.9 kg (143 lb), SpO2 97 %.  I/O:   Intake/Output Summary (Last 24 hours) at 11/15/16 1510 Last data filed at 11/15/16 1222  Gross per 24 hour  Intake          2118.75 ml  Output             1200 ml  Net           918.75 ml    PHYSICAL EXAMINATION:  GENERAL:  81 y.o.-year-old patient lying in the bed with no acute distress.  EYES: Pupils equal, round, reactive to light and accommodation. No scleral icterus. Extraocular muscles intact.  HEENT: Head atraumatic, normocephalic. Oropharynx and nasopharynx clear.  NECK:  Supple, no jugular venous distention. No thyroid enlargement, no tenderness.  LUNGS: Normal breath sounds bilaterally, no  wheezing, rales,rhonchi or crepitation. No use of accessory muscles of respiration.  CARDIOVASCULAR: S1, S2 normal. No murmurs, rubs, or gallops.  ABDOMEN: Soft, non-tender, non-distended. Bowel sounds present. No organomegaly or mass.  EXTREMITIES: No pedal edema, cyanosis, or clubbing.  NEUROLOGIC: Cranial nerves II through XII are intact. Muscle strength 5/5 in all extremities. Sensation intact. Gait not checked.  PSYCHIATRIC: The patient is alert and oriented x 3.  SKIN: No obvious rash, lesion, or ulcer.   DATA REVIEW:   CBC  Recent Labs Lab 11/14/16 0331 11/15/16 0335  WBC 4.5  --   HGB 11.0*  --   HCT 31.9*  --   PLT 129* 134*    Chemistries   Recent Labs Lab 11/15/16 0335  NA 142  K 3.8  CL 114*  CO2 25  GLUCOSE 101*  BUN 18  CREATININE 0.81  CALCIUM 7.9*  MG 2.1    Cardiac Enzymes No results for input(s): TROPONINI in the last 168 hours.  Microbiology Results  Results for orders placed or performed during the hospital encounter of 11/13/16  C difficile quick scan w PCR reflex     Status: None   Collection Time: 11/13/16 11:56 AM  Result Value Ref Range Status   C Diff antigen NEGATIVE NEGATIVE Final   C Diff toxin NEGATIVE NEGATIVE Final   C Diff interpretation No C. difficile detected.  Final  Gastrointestinal Panel by PCR , Stool     Status: Abnormal   Collection Time: 11/13/16 11:56 AM  Result Value Ref Range Status   Campylobacter species NOT DETECTED NOT DETECTED Final   Plesimonas shigelloides NOT DETECTED NOT DETECTED Final   Salmonella species NOT DETECTED NOT DETECTED Final   Yersinia enterocolitica NOT DETECTED NOT DETECTED Final   Vibrio species NOT DETECTED NOT DETECTED Final   Vibrio cholerae NOT DETECTED NOT DETECTED Final   Enteroaggregative E coli (EAEC) NOT DETECTED NOT DETECTED Final   Enteropathogenic E coli (EPEC) NOT DETECTED NOT DETECTED Final   Enterotoxigenic E coli (ETEC) NOT DETECTED NOT DETECTED Final   Shiga like toxin  producing E coli (STEC) NOT DETECTED NOT DETECTED Final   Shigella/Enteroinvasive E coli (EIEC) NOT DETECTED NOT DETECTED Final   Cryptosporidium NOT DETECTED NOT DETECTED Final   Cyclospora cayetanensis NOT DETECTED NOT DETECTED Final   Entamoeba histolytica NOT DETECTED NOT DETECTED Final   Giardia lamblia NOT DETECTED NOT DETECTED Final   Adenovirus F40/41 NOT DETECTED NOT DETECTED Final   Astrovirus NOT DETECTED NOT DETECTED Final   Norovirus GI/GII DETECTED (A) NOT DETECTED Final    Comment: RESULT CALLED TO, READ  BACK BY AND VERIFIED WITH: ANESSA MACROHON AT 2058 11/13/16.PMH    Rotavirus A NOT DETECTED NOT DETECTED Final   Sapovirus (I, II, IV, and V) NOT DETECTED NOT DETECTED Final  MRSA PCR Screening     Status: None   Collection Time: 11/14/16  1:55 AM  Result Value Ref Range Status   MRSA by PCR NEGATIVE NEGATIVE Final    Comment:        The GeneXpert MRSA Assay (FDA approved for NASAL specimens only), is one component of a comprehensive MRSA colonization surveillance program. It is not intended to diagnose MRSA infection nor to guide or monitor treatment for MRSA infections.     RADIOLOGY:  Dg Pelvis 1-2 Views  Result Date: 11/13/2016 CLINICAL DATA:  Recent falls. EXAM: PELVIS - 1-2 VIEW COMPARISON:  None. FINDINGS: There is a fracture of the inferior right pubic ramus. There appears to be callus formation suggesting it is chronic. The proximal femurs are intact with no evidence of hip fracture on single AP views. The left-sided pelvic bones are normal in appearance. The right superior pubic ramus is not well assessed due to patient rotation. If there is concern in this region, a repeat film without rotation could further evaluate. No other abnormalities. IMPRESSION: 1. No hip fractures identified. 2. The right inferior pubic ramus fracture with callus formation is chronic. 3. The right superior pubic ramus, particularly as it approaches the right acetabulum, is not well  assessed due to patient rotation. If there is concern in this region, a CT scan or repeat nonrotated x-ray could be performed. 4. No other abnormalities. Electronically Signed   By: Gerome Sam III M.D   On: 11/13/2016 17:08    EKG:   Orders placed or performed during the hospital encounter of 11/13/16  . EKG 12-Lead  . EKG 12-Lead  . EKG 12-Lead  . EKG 12-Lead  . ED EKG  . ED EKG      Management plans discussed with the patient, family and they are in agreement.  CODE STATUS:     Code Status Orders        Start     Ordered   11/14/16 1617  Do not attempt resuscitation (DNR)  Continuous    Question Answer Comment  In the event of cardiac or respiratory ARREST Do not call a "code blue"   In the event of cardiac or respiratory ARREST Do not perform Intubation, CPR, defibrillation or ACLS   In the event of cardiac or respiratory ARREST Use medication by any route, position, wound care, and other measures to relive pain and suffering. May use oxygen, suction and manual treatment of airway obstruction as needed for comfort.      11/14/16 1616    Code Status History    Date Active Date Inactive Code Status Order ID Comments User Context   11/13/2016  8:55 PM 11/14/2016  4:16 PM Full Code [id number]  Oralia Manis, MD Inpatient    Advance Directive Documentation     Most Recent Value  Type of Advance Directive  Healthcare Power of Attorney, Out of facility DNR (pink MOST or yellow form)  Pre-existing out of facility DNR order (yellow form or pink MOST form)  Yellow form placed in chart (order not valid for inpatient use)  "MOST" Form in Place?  -      TOTAL TIME TAKING CARE OF THIS PATIENT: 40 minutes.    Katharina Caper M.D on 11/15/2016 at 3:10 PM  Between 7am to 6pm -  Pager - [phone]  After 6pm go to [email or web address] - password [email or web address]  [facility]  Office  [phone]  CC: Primary care physician; Patient, No Pcp Per

## 2016-11-15 NOTE — Progress Notes (Signed)
Pts daughter at bedside with portable O2 tank (2L connected) to transfer [facility] back [facility]. Pt wheeled to car by staff

## 2016-11-15 NOTE — Progress Notes (Signed)
Patient is medically stable for D/C back to [facility] today. Clinical Child psychotherapistocial Worker (CSW) prepared D/C packet, including home health orders. CSW confirmed with [facility] that patient can return today. Patient's daughter Dois DavenportSandra will provide transport and bring portable oxygen tank [facility]. RN aware of above. Please reconsult if future social work needs arise. CSW signing off.   [employer] Hughes Inc[employer]Bailey Bettyann Birchler, LCSW [phone]

## 2016-11-15 NOTE — [place] FL2 (Signed)
[place] MEDICAID FL2 LEVEL OF CARE SCREENING TOOL     IDENTIFICATION  Patient Name: Allison Holland Birthdate: 16-Jun-1935 Sex: female Admission Date (Current Location): 11/13/2016  [place]   Facility and Address:  [facility], [address], [place], [place] [address]      Provider Number: [id number]  Attending Physician Name and Address:  Katharina CaperVaickute, Treshawn Allen, MD  Relative Name and Phone Number:       Current Level of Care: Hospital Recommended Level of Care: Assisted Living Facility Prior Approval Number:    Date Approved/Denied:   PASRR Number:    Discharge Plan: Domiciliary (Rest home)    Current Diagnoses: Patient Active Problem List   Diagnosis Date Noted  . Enteritis due to Norovirus 11/15/2016  . Hypovolemic shock (HCC) 11/15/2016  . Hypotension 11/15/2016  . Hypokalemia 11/15/2016  . Hypomagnesemia 11/15/2016  . Thrombocytopenia (HCC) 11/15/2016  . Generalized weakness 11/15/2016  . Altered mental status 11/15/2016  . Diarrhea 11/13/2016  . HTN (hypertension) 11/13/2016  . Depression 11/13/2016  . Dementia 11/13/2016  . GERD (gastroesophageal reflux disease) 11/13/2016  . COPD (chronic obstructive pulmonary disease) (HCC) 11/13/2016    Orientation RESPIRATION BLADDER Height & Weight     Self, Place  O2 (2L o2) Continent Weight: 143 lb (64.9 kg) Height:  5\' 5"  (165.1 cm)  BEHAVIORAL SYMPTOMS/MOOD NEUROLOGICAL BOWEL NUTRITION STATUS      Continent  Diet: Low Sodium/ Heart Healthy   AMBULATORY STATUS COMMUNICATION OF NEEDS Skin   Limited Assist Verbally Normal                       Personal Care Assistance Level of Assistance  Bathing, Feeding, Dressing Bathing Assistance: Limited assistance Feeding assistance: Independent Dressing Assistance: Limited assistance     Functional Limitations Info             SPECIAL CARE FACTORS FREQUENCY   PT home health   2-3 days per week                   Contractures      Additional Factors Info   On Contact Isolation precautions for Norovirus.               Discharge Medications: Please see discharge summary for a list of discharge medications. Current Discharge Medication List        START taking these medications   Details  cephALEXin (KEFLEX) 500 MG capsule Take 1 capsule (500 mg total) by mouth 2 (two) times daily. Qty: 4 capsule, Refills: 0          CONTINUE these medications which have NOT CHANGED   Details  atorvastatin (LIPITOR) 20 MG tablet Take 20 mg by mouth daily.    budesonide (PULMICORT) 0.25 MG/2ML nebulizer solution Take 0.25 mg by nebulization 2 (two) times daily.    budesonide-formoterol (SYMBICORT) 80-4.5 MCG/ACT inhaler Inhale 2 puffs into the lungs 2 (two) times daily.    calcium carbonate (CALCIUM 600) 600 MG TABS tablet Take 600 mg by mouth 2 (two) times daily with a meal.    escitalopram (LEXAPRO) 20 MG tablet Take 20 mg by mouth daily.    hydrochlorothiazide (HYDRODIURIL) 12.5 MG tablet Take 12.5 mg by mouth daily.    lisinopril (PRINIVIL,ZESTRIL) 10 MG tablet Take 10 mg by mouth daily.    Multiple Vitamins-Minerals (CERTAVITE SENIOR/ANTIOXIDANT) TABS Take 1 tablet by mouth daily.    pantoprazole (PROTONIX) 40 MG tablet Take  40 mg by mouth daily.    risperiDONE (RISPERDAL) 0.25 MG tablet Take 0.25 mg by mouth at bedtime.    tiotropium (SPIRIVA) 18 MCG inhalation capsule Place 18 mcg into inhaler and inhale daily.    Acetaminophen 500 MG coapsule Take 650 mg by mouth every 6 (six) hours as needed.    albuterol (PROVENTIL HFA;VENTOLIN HFA) 108 (90 Base) MCG/ACT inhaler Inhale 2 puffs into the lungs every 4 (four) hours as needed for wheezing or shortness of breath.    guaiFENesin (MUCINEX) 600 MG 12 hr tablet Take by mouth 2 (two) times daily.    loperamide (IMODIUM) 2 MG capsule Take by mouth as needed for diarrhea or loose stools.         STOP  taking these medications     budesonide-formoterol (SYMBICORT) 160-4.5 MCG/ACT inhaler       Relevant Imaging Results: Relevant Lab Results: Additional Information SS# [id number]  Sample, Darleen Crocker, [place]

## 2016-11-15 NOTE — Progress Notes (Signed)
Report called to BeaverStephanie at [facility].

## 2016-11-16 LAB — URINE CULTURE

## 2018-06-24 DEATH — deceased

## 2019-04-05 IMAGING — CT CT HEAD W/O CM
3 series · 15 of 46 positions shown, 18 images · non-contrast
Comparison: None.

CLINICAL DATA: 81-year-old female with a history of fall

EXAM:
CT HEAD WITHOUT CONTRAST
TECHNIQUE: Contiguous axial images were obtained from the base of the skull
through the vertex without intravenous contrast.

[Series 2: head wo · axial · 0.47mm/px · z∈[-143,-23]mm · 9 of 29 slices shown, 12 images]
[im 3/29  brain]
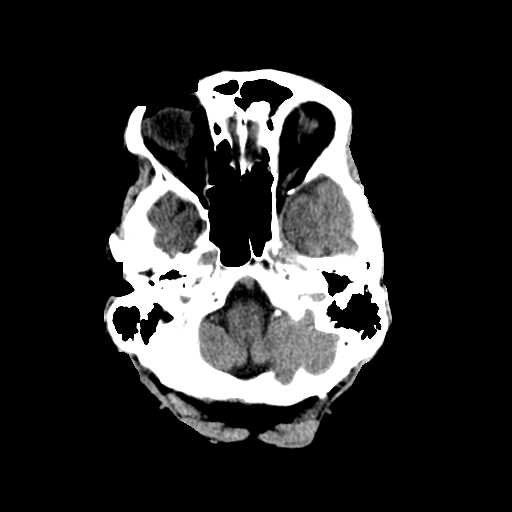
[im 3/29  bone]
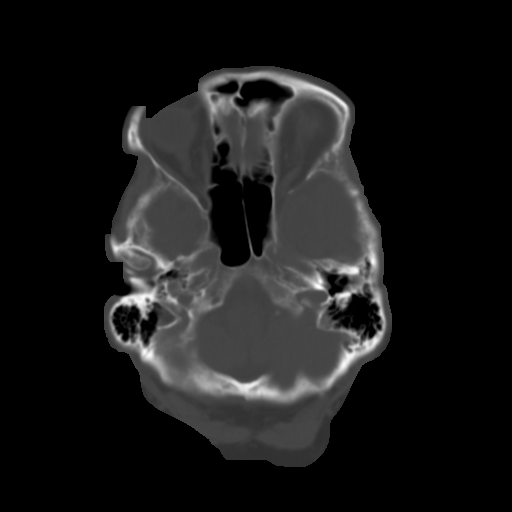
[im 6/29  brain]
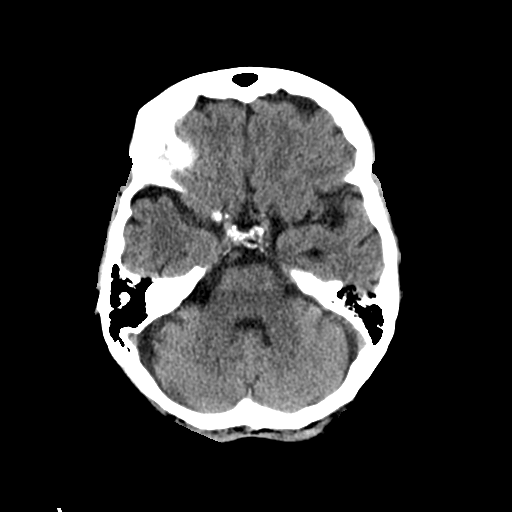
[im 9/29  brain]
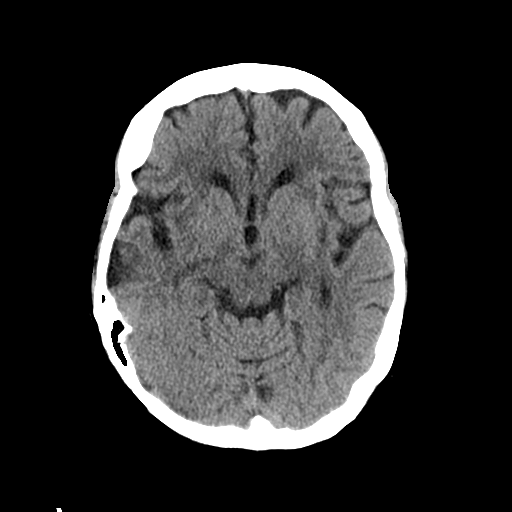
[im 12/29  brain]
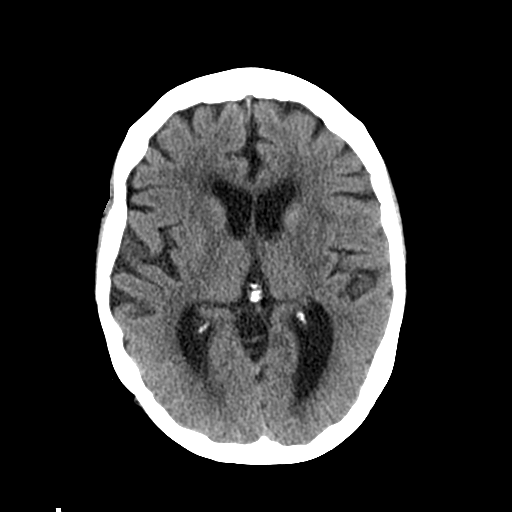
[im 15/29  brain]
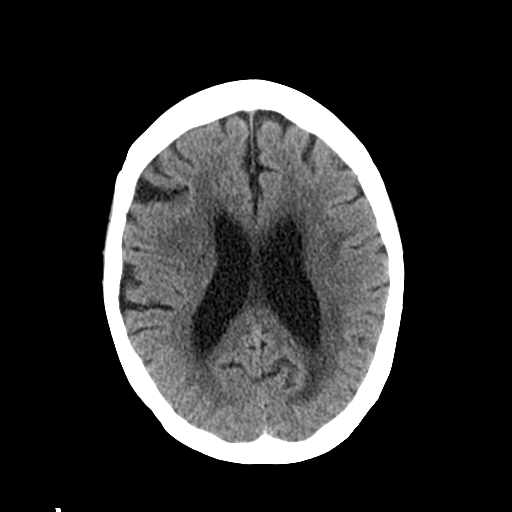
[im 15/29  bone]
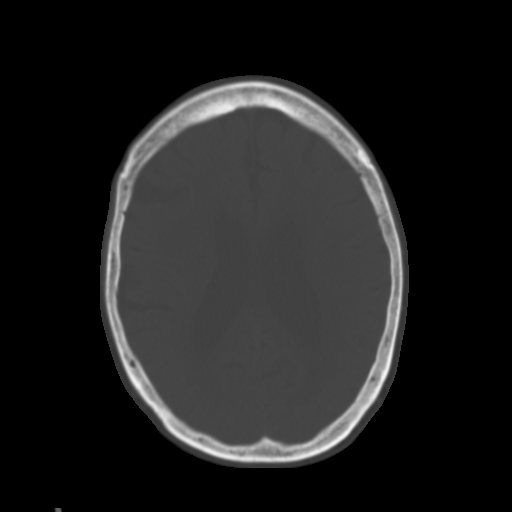
[im 18/29  brain]
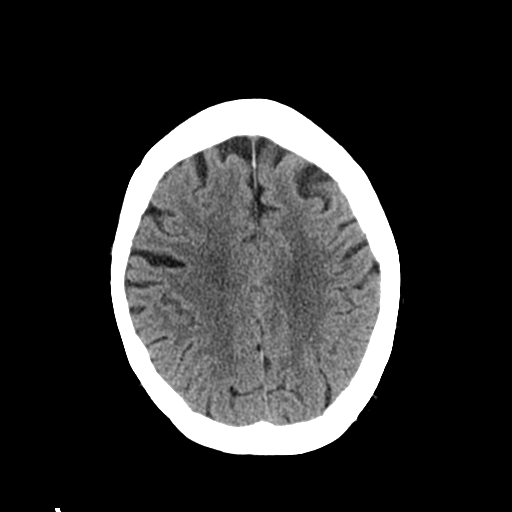
[im 21/29  brain]
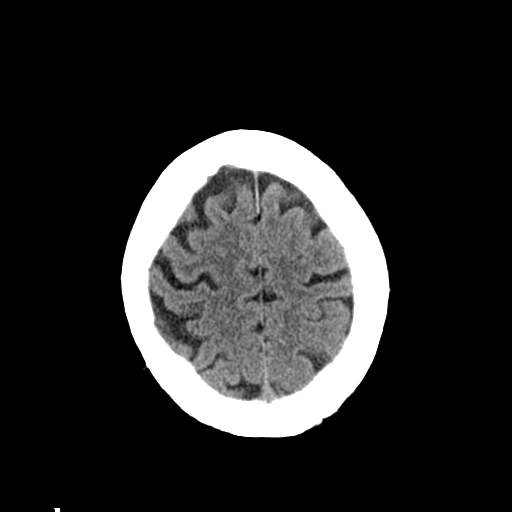
[im 24/29  brain]
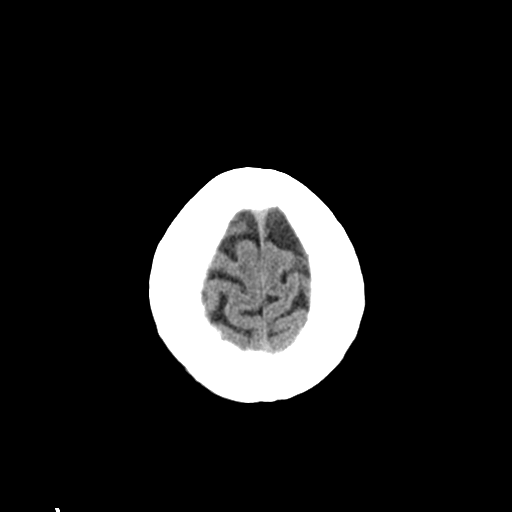
[im 27/29  brain]
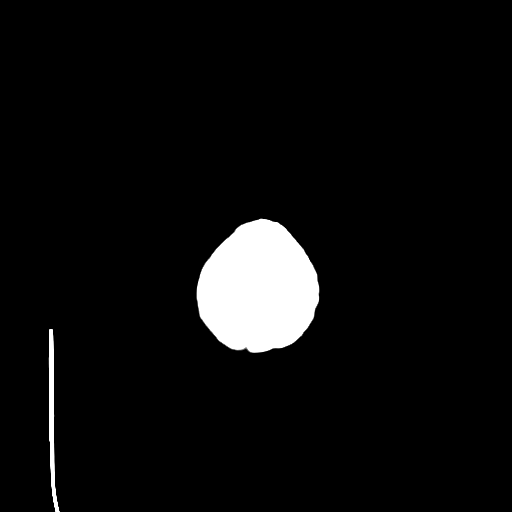
[im 27/29  bone]
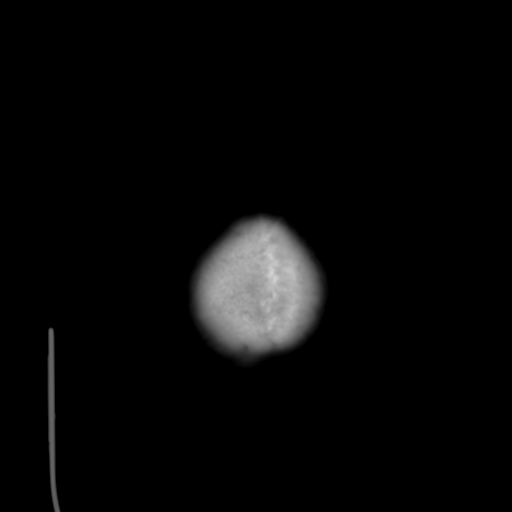

[Series 4: coronal soft tissue · coronal · 0.28mm/px · 3 of 67 slices shown]
[im 23/67  brain]
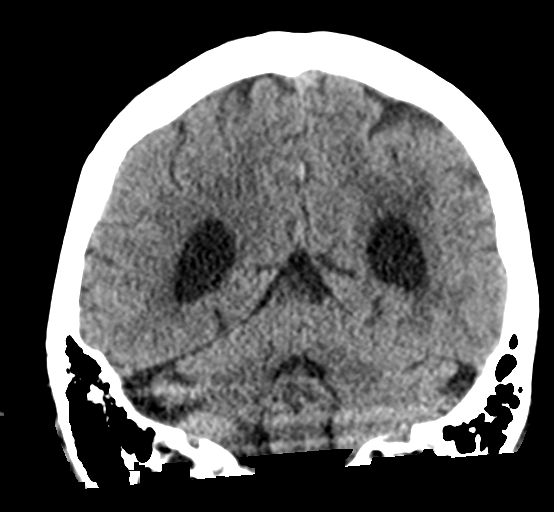
[im 30/67  brain]
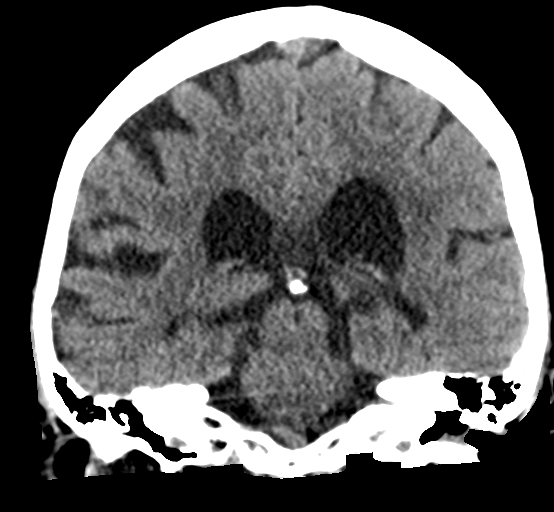
[im 37/67  brain]
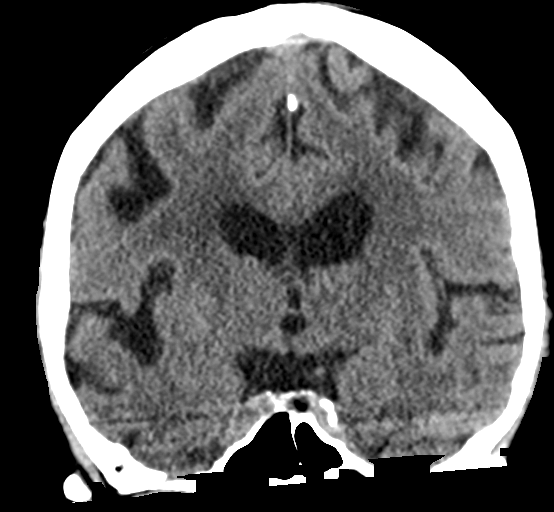

[Series 5: sagittal soft tissue · sagittal · 0.29mm/px · 3 of 52 slices shown]
[im 18/52  brain]
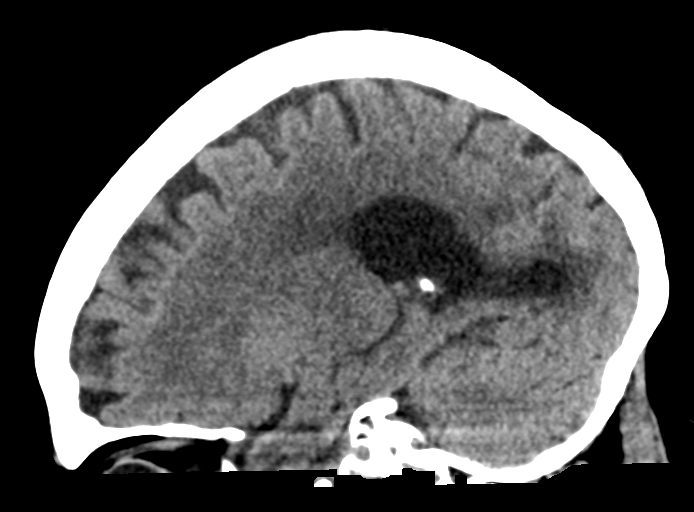
[im 26/52  brain]
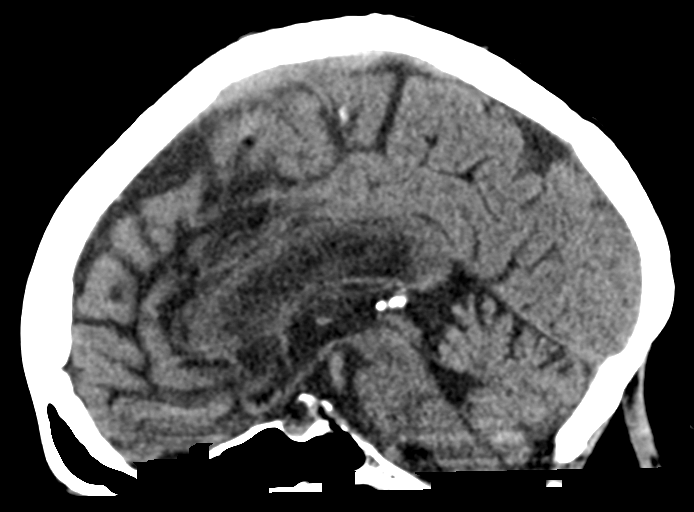
[im 35/52  brain]
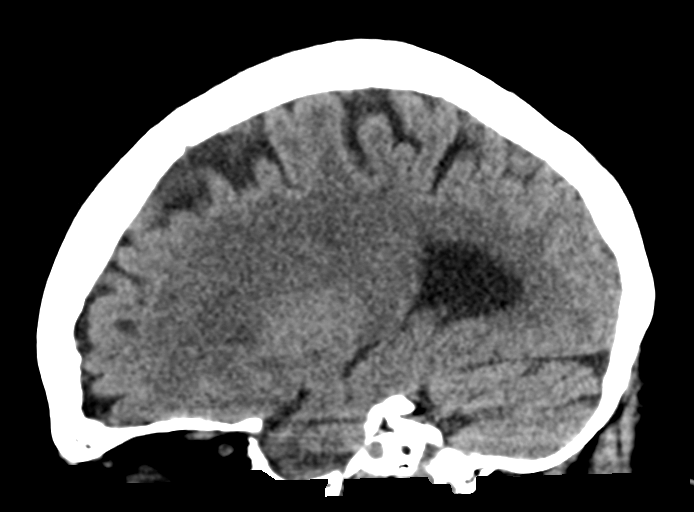

[15 of 46 positions shown; findings below may reference images not displayed]

FINDINGS: Brain: No acute intracranial hemorrhage. No midline shift or mass
effect. Mild volume loss. Unremarkable configuration the ventricles.

Minimal hypodensity in the periventricular white matter. Gray-white
differentiation maintained.

Vascular: Calcifications of the intracranial vasculature.

Skull: No displaced fracture.  No aggressive bone lesions.

Sinuses/Orbits: Unremarkable appearance of the orbits. Unremarkable
paranasal sinuses.

Other: None
IMPRESSION: No CT evidence of acute intracranial abnormality.

Chronic microvascular ischemic disease and associated intracranial
atherosclerosis
